# Patient Record
Sex: Male | Born: 1970 | Race: White | Marital: Married | Smoking: Former smoker
Health system: Southern US, Community
[De-identification: ages and names within clinical notes are randomized; demographics above are authoritative.]

---

## 2016-04-02 ENCOUNTER — Emergency Department: Payer: No Typology Code available for payment source

## 2016-04-02 ENCOUNTER — Encounter: Payer: Self-pay | Admitting: Emergency Medicine

## 2016-04-02 ENCOUNTER — Inpatient Hospital Stay: Payer: No Typology Code available for payment source

## 2016-04-02 ENCOUNTER — Inpatient Hospital Stay
Admission: EM | Admit: 2016-04-02 | Discharge: 2016-04-04 | DRG: 190 | Disposition: A | Discharge status: Home / Self Care | Payer: No Typology Code available for payment source | Attending: Specialist | Admitting: Specialist

## 2016-04-02 DIAGNOSIS — R042 Hemoptysis: Secondary | ICD-10-CM | POA: Diagnosis present

## 2016-04-02 DIAGNOSIS — R0902 Hypoxemia: Secondary | ICD-10-CM | POA: Diagnosis present

## 2016-04-02 DIAGNOSIS — R030 Elevated blood-pressure reading, without diagnosis of hypertension: Secondary | ICD-10-CM | POA: Diagnosis present

## 2016-04-02 DIAGNOSIS — J189 Pneumonia, unspecified organism: Secondary | ICD-10-CM | POA: Diagnosis present

## 2016-04-02 DIAGNOSIS — F17210 Nicotine dependence, cigarettes, uncomplicated: Secondary | ICD-10-CM | POA: Diagnosis present

## 2016-04-02 DIAGNOSIS — J440 Chronic obstructive pulmonary disease with acute lower respiratory infection: Principal | ICD-10-CM | POA: Diagnosis present

## 2016-04-02 DIAGNOSIS — J449 Chronic obstructive pulmonary disease, unspecified: Secondary | ICD-10-CM | POA: Diagnosis not present

## 2016-04-02 DIAGNOSIS — J441 Chronic obstructive pulmonary disease with (acute) exacerbation: Secondary | ICD-10-CM | POA: Diagnosis present

## 2016-04-02 LAB — COMPREHENSIVE METABOLIC PANEL
ALBUMIN: 3.6 g/dL (ref 3.5–5.0)
ALT: 18 U/L (ref 17–63)
ALT: 17 U/L (ref 17–63)
AST: 22 U/L (ref 15–41)
AST: 17 U/L (ref 15–41)
Albumin: 4 g/dL (ref 3.5–5.0)
Alkaline Phosphatase: 80 U/L (ref 38–126)
Alkaline Phosphatase: 67 U/L (ref 38–126)
Anion gap: 8 (ref 5–15)
Anion gap: 7 (ref 5–15)
BILIRUBIN TOTAL: 0.7 mg/dL (ref 0.3–1.2)
BUN: 18 mg/dL (ref 6–20)
BUN: 16 mg/dL (ref 6–20)
CHLORIDE: 105 mmol/L (ref 101–111)
CHLORIDE: 107 mmol/L (ref 101–111)
CO2: 23 mmol/L (ref 22–32)
CO2: 23 mmol/L (ref 22–32)
CREATININE: 0.94 mg/dL (ref 0.61–1.24)
CREATININE: 0.85 mg/dL (ref 0.61–1.24)
Calcium: 9.3 mg/dL (ref 8.9–10.3)
Calcium: 8.5 mg/dL — ABNORMAL LOW (ref 8.9–10.3)
GFR calc Af Amer: >60 mL/min (ref >60)
GFR calc Af Amer: >60 mL/min (ref >60)
GLUCOSE: 92 mg/dL (ref 65–99)
Glucose, Bld: 121 mg/dL — ABNORMAL HIGH (ref 65–99)
POTASSIUM: 3.8 mmol/L (ref 3.5–5.1)
Potassium: 3.7 mmol/L (ref 3.5–5.1)
Sodium: 136 mmol/L (ref 135–145)
Sodium: 137 mmol/L (ref 135–145)
TOTAL PROTEIN: 7.1 g/dL (ref 6.5–8.1)
Total Bilirubin: 0.7 mg/dL (ref 0.3–1.2)
Total Protein: 6.3 g/dL — ABNORMAL LOW (ref 6.5–8.1)

## 2016-04-02 LAB — RAPID HIV SCREEN (HIV 1/2 AB+AG)
HIV 1/2 ANTIBODIES: NONREACTIVE
HIV-1 P24 ANTIGEN - HIV24: NONREACTIVE

## 2016-04-02 LAB — CBC WITH DIFFERENTIAL/PLATELET
BASOS ABS: 0.1 10*3/uL (ref 0–0.1)
Basophils Relative: 1 %
EOS ABS: 0.3 10*3/uL (ref 0–0.7)
EOS PCT: 3 %
HCT: 42.9 % (ref 40.0–52.0)
HEMOGLOBIN: 14.7 g/dL (ref 13.0–18.0)
LYMPHS PCT: 20 %
Lymphs Abs: 2.4 10*3/uL (ref 1.0–3.6)
MCH: 28.8 pg (ref 26.0–34.0)
MCHC: 34.2 g/dL (ref 32.0–36.0)
MCV: 84.4 fL (ref 80.0–100.0)
Monocytes Absolute: 0.8 10*3/uL (ref 0.2–1.0)
Monocytes Relative: 7 %
NEUTROS PCT: 69 %
Neutro Abs: 8.1 10*3/uL — ABNORMAL HIGH (ref 1.4–6.5)
PLATELETS: 363 10*3/uL (ref 150–440)
RBC: 5.09 MIL/uL (ref 4.40–5.90)
RDW: 13.7 % (ref 11.5–14.5)
WBC: 11.7 10*3/uL — AB (ref 3.8–10.6)

## 2016-04-02 LAB — URINE DRUG SCREEN, QUALITATIVE (ARMC ONLY)
AMPHETAMINES, UR SCREEN: NOT DETECTED
BARBITURATES, UR SCREEN: NOT DETECTED
BENZODIAZEPINE, UR SCRN: NOT DETECTED
Cannabinoid 50 Ng, Ur ~~LOC~~: POSITIVE — AB
Cocaine Metabolite,Ur ~~LOC~~: NOT DETECTED
MDMA (Ecstasy)Ur Screen: NOT DETECTED
METHADONE SCREEN, URINE: NOT DETECTED
Opiate, Ur Screen: NOT DETECTED
Phencyclidine (PCP) Ur S: NOT DETECTED
Tricyclic, Ur Screen: NOT DETECTED

## 2016-04-02 LAB — LACTIC ACID, PLASMA
LACTIC ACID, VENOUS: 0.7 mmol/L (ref 0.5–2.0)
LACTIC ACID, VENOUS: 0.7 mmol/L (ref 0.5–2.0)

## 2016-04-02 LAB — EXPECTORATED SPUTUM ASSESSMENT W REFEX TO RESP CULTURE

## 2016-04-02 LAB — TYPE AND SCREEN
ABO/RH(D): A POS
Antibody Screen: NEGATIVE

## 2016-04-02 LAB — EXPECTORATED SPUTUM ASSESSMENT W GRAM STAIN, RFLX TO RESP C: Special Requests: NORMAL

## 2016-04-02 LAB — MAGNESIUM: MAGNESIUM: 1.8 mg/dL (ref 1.7–2.4)

## 2016-04-02 LAB — INFLUENZA PANEL BY PCR (TYPE A & B)
H1N1 flu by pcr: NOT DETECTED
INFLAPCR: NEGATIVE
Influenza B By PCR: NEGATIVE

## 2016-04-02 LAB — ETHANOL

## 2016-04-02 LAB — PROTIME-INR
INR: 0.92
Prothrombin Time: 12.6 seconds (ref 11.4–15.0)

## 2016-04-02 LAB — GLUCOSE, CAPILLARY: GLUCOSE-CAPILLARY: 93 mg/dL (ref 65–99)

## 2016-04-02 LAB — TROPONIN I: Troponin I: <0.03 ng/mL (ref <0.031)

## 2016-04-02 LAB — MRSA PCR SCREENING: MRSA BY PCR: NEGATIVE

## 2016-04-02 LAB — ABO/RH: ABO/RH(D): A POS

## 2016-04-02 LAB — PHOSPHORUS: Phosphorus: 2.9 mg/dL (ref 2.5–4.6)

## 2016-04-02 MED ORDER — DEXTROSE 5 % IV SOLN
500.0000 mg | Freq: Once | INTRAVENOUS | Status: DC
  Administered 2016-04-02: 500 mg via INTRAVENOUS

## 2016-04-02 MED ORDER — SODIUM CHLORIDE 0.9 % IV BOLUS (SEPSIS)
1000.0000 mL | Freq: Once | INTRAVENOUS | Status: AC
  Administered 2016-04-02: 1000 mL via INTRAVENOUS

## 2016-04-02 MED ORDER — SODIUM CHLORIDE 0.9 % IV SOLN: 250.0000 mL | INTRAVENOUS | Status: DC | PRN

## 2016-04-02 MED ORDER — HYDRALAZINE HCL 20 MG/ML IJ SOLN
10.0000 mg | INTRAMUSCULAR | Status: DC | PRN
  Administered 2016-04-03: 10 mg via INTRAVENOUS
  Filled 2016-04-02: qty 1

## 2016-04-02 MED ORDER — DEXTROSE 5 % IV SOLN
1.0000 g | INTRAVENOUS | Status: DC
  Administered 2016-04-03 – 2016-04-04 (×2): 1 g via INTRAVENOUS
  Filled 2016-04-02 (×2): qty 10

## 2016-04-02 MED ORDER — DEXTROSE 5 % IV SOLN
500.0000 mg | INTRAVENOUS | Status: DC
  Administered 2016-04-03 – 2016-04-04 (×2): 500 mg via INTRAVENOUS
  Filled 2016-04-02 (×3): qty 500

## 2016-04-02 MED ORDER — FAMOTIDINE IN NACL 20-0.9 MG/50ML-% IV SOLN
20.0000 mg | Freq: Two times a day (BID) | INTRAVENOUS | Status: DC
  Administered 2016-04-02 – 2016-04-04 (×5): 20 mg via INTRAVENOUS
  Filled 2016-04-02 (×6): qty 50

## 2016-04-02 MED ORDER — BUDESONIDE 0.5 MG/2ML IN SUSP
0.5000 mg | Freq: Two times a day (BID) | RESPIRATORY_TRACT | Status: DC
  Administered 2016-04-02 – 2016-04-04 (×5): 0.5 mg via RESPIRATORY_TRACT
  Filled 2016-04-02 (×5): qty 2

## 2016-04-02 MED ORDER — SODIUM CHLORIDE 0.9% FLUSH: 3.0000 mL | INTRAVENOUS | Status: DC | PRN

## 2016-04-02 MED ORDER — IPRATROPIUM-ALBUTEROL 0.5-2.5 (3) MG/3ML IN SOLN
3.0000 mL | RESPIRATORY_TRACT | Status: DC
  Administered 2016-04-02 – 2016-04-03 (×5): 3 mL via RESPIRATORY_TRACT
  Filled 2016-04-02 (×5): qty 3

## 2016-04-02 MED ORDER — DEXTROSE 5 % IV SOLN
500.0000 mg | Freq: Once | INTRAVENOUS | Status: AC
  Administered 2016-04-02: 500 mg via INTRAVENOUS
  Filled 2016-04-02: qty 500

## 2016-04-02 MED ORDER — METHYLPREDNISOLONE SODIUM SUCC 40 MG IJ SOLR
40.0000 mg | Freq: Two times a day (BID) | INTRAMUSCULAR | Status: DC
  Administered 2016-04-02 – 2016-04-04 (×4): 40 mg via INTRAVENOUS
  Filled 2016-04-02 (×4): qty 1

## 2016-04-02 MED ORDER — ONDANSETRON HCL 4 MG/2ML IJ SOLN: 4.0000 mg | Freq: Four times a day (QID) | INTRAMUSCULAR | Status: DC | PRN

## 2016-04-02 MED ORDER — SODIUM CHLORIDE 0.9% FLUSH
3.0000 mL | Freq: Two times a day (BID) | INTRAVENOUS | Status: DC
  Administered 2016-04-02 – 2016-04-03 (×4): 3 mL via INTRAVENOUS

## 2016-04-02 MED ORDER — ACETAMINOPHEN 325 MG PO TABS
650.0000 mg | ORAL_TABLET | ORAL | Status: DC | PRN
  Administered 2016-04-04: 650 mg via ORAL
  Filled 2016-04-02: qty 2

## 2016-04-02 MED ORDER — DEXTROSE 5 % IV SOLN
1.0000 g | Freq: Once | INTRAVENOUS | Status: DC
  Filled 2016-04-02: qty 10

## 2016-04-02 MED ORDER — IOPAMIDOL (ISOVUE-370) INJECTION 76%
75.0000 mL | Freq: Once | INTRAVENOUS | Status: AC | PRN
  Administered 2016-04-02: 75 mL via INTRAVENOUS

## 2016-04-02 NOTE — H&P (Signed)
[facility] Pulmonary Medicine Consultation      Date: 04/02/2016,   MRN# [id number] Kjell Brannen 1971-08-17 Code Status:     Code Status Orders        Start     Ordered   04/02/16 1027  Full code   Continuous     04/02/16 1031    Code Status History    Date Active Date Inactive Code Status Order ID Comments User Context   This patient has a current code status but no historical code status.     Hosp day:@LENGTHOFSTAYDAYS @ Referring MD: @     PCP:      AdmissionWeight: 180 lb (81.647 kg)                 CurrentWeight: 180 lb (81.647 kg)      CHIEF COMPLAINT:   Acute massive hemoptysis   HISTORY OF PRESENT ILLNESS   45 yo white male with extensive smoking history with h/o Marijuana abuse seen today and admitted for acute and severe hemoptysis since 2 days ago. Patient had difficulty breathing and hypoxia, patient states that he feels much better over last 1 hr.  Patient states that he was coughing up 2 cup fulls of blood. Patient with elevated BP, patient denies drug use  Ct chest shows extensive b/l emphysema with Left upper and lower lobe pneumonia Patient has no fevers, chills, NVD.    PAST MEDICAL HISTORY   History reviewed. No pertinent past medical history.   SURGICAL HISTORY   History reviewed. No pertinent past surgical history.   FAMILY HISTORY   History reviewed. No pertinent family history. no h/o cancer No h/o ca  SOCIAL HISTORY   Social History  Substance Use Topics  . Smoking status: Current Every Day Smoker -- 1.50 packs/day    Types: Cigarettes  . Smokeless tobacco: None  . Alcohol Use: Yes     Comment: daily use.  Last drank 6 pack beer and 2-3 shots of liqour     MEDICATIONS    Home Medication:  No current outpatient prescriptions on file.  Current Medication:  Current facility-administered medications:  .  0.9 %  sodium chloride infusion, 250 mL, Intravenous, PRN, Erin Fulling, MD .  0.9 %  sodium chloride  infusion, 250 mL, Intravenous, PRN, Erin Fulling, MD .  acetaminophen (TYLENOL) tablet 650 mg, 650 mg, Oral, Q4H PRN, Erin Fulling, MD .  azithromycin (ZITHROMAX) 500 mg in dextrose 5 % 250 mL IVPB, 500 mg, Intravenous, Once, Governor Rooks, MD .  cefTRIAXone (ROCEPHIN) 1 g in dextrose 5 % 50 mL IVPB, 1 g, Intravenous, Once, Governor Rooks, MD .  famotidine (PEPCID) IVPB 20 mg premix, 20 mg, Intravenous, Q12H, Erin Fulling, MD, Stopped at 04/02/16 1126 .  hydrALAZINE (APRESOLINE) injection 10 mg, 10 mg, Intravenous, Q4H PRN, Erin Fulling, MD .  ondansetron (ZOFRAN) injection 4 mg, 4 mg, Intravenous, Q6H PRN, Erin Fulling, MD .  sodium chloride flush (NS) 0.9 % injection 3 mL, 3 mL, Intravenous, Q12H, Erin Fulling, MD .  sodium chloride flush (NS) 0.9 % injection 3 mL, 3 mL, Intravenous, PRN, Erin Fulling, MD    ALLERGIES   Penicillins     REVIEW OF SYSTEMS   Review of Systems  Constitutional: Negative for fever, chills, weight loss, malaise/fatigue and diaphoresis.  HENT: Negative for congestion and hearing loss.   Eyes: Negative for blurred vision and double vision.  Respiratory: Positive for cough, hemoptysis, sputum production, shortness of breath and wheezing.   Cardiovascular:  Negative for chest pain, palpitations and orthopnea.  Gastrointestinal: Negative for heartburn, nausea, vomiting, abdominal pain, diarrhea, constipation and blood in stool.  Genitourinary: Negative for dysuria and urgency.  Musculoskeletal: Negative for myalgias, back pain and neck pain.  Skin: Negative for rash.  Neurological: Negative for dizziness, tingling, tremors, weakness and headaches.  Endo/Heme/Allergies: Does not bruise/bleed easily.  Psychiatric/Behavioral: Negative for depression, suicidal ideas and substance abuse.  All other systems reviewed and are negative.    VS: BP 139/98 mmHg  Pulse 78  Temp(Src) 98.2 F (36.8 C) (Oral)  Resp 15  Ht 6' (1.829 m)  Wt 180 lb (81.647 kg)  BMI 24.41 kg/m2   SpO2 96%     PHYSICAL EXAM  Physical Exam  Constitutional: He is oriented to person, place, and time. He appears well-developed and well-nourished. No distress.  HENT:  Head: Normocephalic and atraumatic.  Mouth/Throat: No oropharyngeal exudate.  Eyes: EOM are normal. Pupils are equal, round, and reactive to light. No scleral icterus.  Neck: Normal range of motion. Neck supple.  Cardiovascular: Normal rate, regular rhythm and normal heart sounds.   No murmur heard. Pulmonary/Chest: No stridor. No respiratory distress. He has no wheezes.  Abdominal: Soft. Bowel sounds are normal. He exhibits no distension. There is no tenderness. There is no rebound.  Musculoskeletal: Normal range of motion. He exhibits no edema.  Neurological: He is alert and oriented to person, place, and time. He displays normal reflexes. Coordination normal.  Skin: Skin is warm. He is not diaphoretic.  Psychiatric: He has a normal mood and affect.        LABS    Recent Labs     04/02/16  1009  04/02/16  1156  HGB  14.7   --   HCT  42.9   --   MCV  84.4   --   WBC  11.7*   --   BUN  18  16  CREATININE  0.94  0.85  GLUCOSE  121*  92  CALCIUM  9.3  8.5*  INR  0.92   --   ,    No results for input(s): PH in the last 72 hours.  Invalid input(s): PCO2, PO2, BASEEXCESS, BASEDEFICITE, TFT    CULTURE RESULTS   No results found for this or any previous visit (from the past 240 hour(s)).        IMAGING    Ct Angio Chest Pe W/cm &/or Wo Cm  04/02/2016  CLINICAL DATA:  Hemoptysis EXAM: CT ANGIOGRAPHY CHEST WITH CONTRAST TECHNIQUE: Multidetector CT imaging of the chest was performed using the standard protocol during bolus administration of intravenous contrast. Multiplanar CT image reconstructions and MIPs were obtained to evaluate the vascular anatomy. CONTRAST:  75 mL Isovue 370 IV COMPARISON:  Chest radiograph dated 04/02/2016 FINDINGS: No evidence of pulmonary embolism. Although not tailored  for evaluation of the thoracic aorta, there is no evidence of thoracic aortic aneurysm or dissection. Mediastinum/Nodes: The heart is normal in size. No pericardial effusion. Mild atherosclerotic calcifications aortic arch. No suspicious mediastinal lymphadenopathy. Visualized thyroid is unremarkable. Lungs/Pleura: Mild ground-glass nodular opacities in the lingula and left lower lobe, suspicious for pneumonia. Underlying moderate centrilobular and paraseptal emphysematous changes, upper lobe predominant. Biapical pleural-parenchymal scarring. No suspicious pulmonary nodules. No pleural effusion or pneumothorax. Upper abdomen: Visualized upper abdomen is within normal limits. Musculoskeletal: Mild degenerative changes of the visualized thoracolumbar spine. Review of the MIP images confirms the above findings. IMPRESSION: No evidence of pulmonary embolism. Mild ground-glass nodular  opacities in the lingula and left lower lobe, suspicious for multifocal pneumonia. Electronically Signed   By: Charline Bills M.D.   On: 04/02/2016 11:21   Dg Chest Port 1 View  04/02/2016  CLINICAL DATA:  Hemoptysis for several days. EXAM: PORTABLE CHEST 1 VIEW COMPARISON:  None. FINDINGS: The heart size and mediastinal contours are within normal limits. Both lungs are clear. No evidence of pulmonary infiltrate or edema. No evidence of pleural effusion. Pulmonary hyperinflation, suspicious for COPD. IMPRESSION: Probable COPD.  No active disease. Electronically Signed   By: Myles Rosenthal M.D.   On: 04/02/2016 10:43           ASSESSMENT/PLAN   45 yo white male with acute sub massive hemoptysis from acute left upper lobe multifocal pneumonia with COPD exacerbation  1.start solumedrol 40 bid 2.start rocephin and azithromycin 3.check HIV, INLF PCR 4.check tox screen 5.hydralazine as needed 6.oxygen as needed 7.dounebs and pulmicort nebs 8.step down status 9.sputum culture 10.check AFB  Can transfer to gen med floor  in next 24      I have personally obtained a history, examined the patient, evaluated laboratory and independently reviewed imaging results, formulated the assessment and plan and placed orders.  The Patient requires high complexity decision making for assessment and support, frequent evaluation and titration of therapies, application of advanced monitoring technologies and extensive interpretation of multiple databases.   Patient are satisfied with Plan of action and management. All questions answered  Lucie Leather, M.D.  [facility]  Medical Director [facility] Medical Director [facility] Cardio-Pulmonary Department

## 2016-04-02 NOTE — ED Provider Notes (Signed)
[facility] Emergency Department Provider Note   ____________________________________________  Time seen: Immediately upon arrival to room I have reviewed the triage vital signs and the triage nursing note.  HISTORY  Chief Complaint Hemoptysis   Historian Patient  HPI Walter Pratt is a 45 y.o. male who does not follow with doctors, is a smoker, reports coughed up some blood on Thursday, continued to have episodes of coughing up bright red blood 40 and Saturday, and more short of breath this morning, continuing to cough up blood. Prior to the coughing up blood, there is no shortness of breath, fever, chills, or otherwise sputum production. Denies abdominal pain or vomiting. This morning he had some blood come out of his nose, which he thinks came up from his coughing and then came out of his nose. He is not taking a blood thinner.  The nurse at his work has been treating him with a nasal spray for sinus congestion.    History reviewed. No pertinent past medical history.  Patient Active Problem List   Diagnosis Date Noted  . Hemoptysis 04/02/2016    History reviewed. No pertinent past surgical history.  Current Outpatient Rx  Name  Route  Sig  Dispense  Refill  . aspirin-acetaminophen-caffeine (EXCEDRIN MIGRAINE) 250-250-65 MG tablet   Oral   Take 3 tablets by mouth 2 (two) times daily as needed for migraine.           Allergies Penicillins  History reviewed. No pertinent family history.  Social History Social History  Substance Use Topics  . Smoking status: Current Every Day Smoker -- 1.50 packs/day    Types: Cigarettes  . Smokeless tobacco: None  . Alcohol Use: Yes     Comment: daily use.  Last drank 6 pack beer and 2-3 shots of liqour    Review of Systems  Constitutional: Negative for fever. Eyes: Negative for visual changes. ENT: Negative for sore throat. Cardiovascular: Negative for chest pain.Sensation of  "gurgling" Respiratory: Positive for shortness of breath. Gastrointestinal: Negative for abdominal pain, vomiting and diarrhea. Genitourinary: Negative for dysuria. Musculoskeletal: Negative for back pain. Skin: Negative for rash. Neurological: Negative for headache. 10 point Review of Systems otherwise negative ____________________________________________   PHYSICAL EXAM:  VITAL SIGNS: ED Triage Vitals  Enc Vitals Group     BP 04/02/16 0955 149/96 mmHg     Pulse Rate 04/02/16 0955 110     Resp 04/02/16 0955 18     Temp 04/02/16 0955 98.2 F (36.8 C)     Temp Source 04/02/16 0955 Oral     SpO2 04/02/16 0955 96 %     Weight 04/02/16 0955 180 lb (81.647 kg)     Height 04/02/16 0955 6' (1.829 m)     Head Cir --      Peak Flow --      Pain Score 04/02/16 1003 0     Pain Loc --      Pain Edu? --      Excl. in GC? --      Constitutional: Alert and oriented. Well appearing and in no distress. HEENT   Head: Normocephalic and atraumatic.      Eyes: Conjunctivae are normal. PERRL. Normal extraocular movements.      Ears:         Nose: No congestion/rhinnorhea.   Mouth/Throat: Mucous membranes are moist.   Neck: No stridor. Cardiovascular/Chest: Tachycardic, regular rhythm.  No murmurs, rubs, or gallops. Respiratory: Normal respiratory effort without tachypnea nor retractions. Mild  wheezing. Mild decreased air movement throughout Gastrointestinal: Soft. No distention, no guarding, no rebound. Nontender.  Genitourinary/rectal:Deferred Musculoskeletal: Nontender with normal range of motion in all extremities. No joint effusions.  No lower extremity tenderness.  No edema. Neurologic:  Normal speech and language. No gross or focal neurologic deficits are appreciated. Skin:  Skin is warm, dry and intact. No rash noted. Psychiatric: Mood and affect are normal. Speech and behavior are normal. Patient exhibits appropriate insight and  judgment.  ____________________________________________   EKG I, Governor Rooks, MD, the attending physician have personally viewed and interpreted all ECGs.  91 bpm. Normal sinus rhythm. Narrow QRS. Normal ST and T-wave ____________________________________________  LABS (pertinent positives/negatives)  Comprehensive metabolic panel without significant abnormalities Troponin less than 0.03 White blood count 11.7, hemoglobin 14.7 and platelet count 363 INR 0.92  ____________________________________________  RADIOLOGY All Xrays were viewed by me. Imaging interpreted by Radiologist.  Portable 1 view: Chest: Probable COPD. No active disease  CT chest for PE:  IMPRESSION: No evidence of pulmonary embolism.  Mild ground-glass nodular opacities in the lingula and left lower lobe, suspicious for multifocal pneumonia. __________________________________________  PROCEDURES  Procedure(s) performed: None  Critical Care performed: CRITICAL CARE Performed by: Governor Rooks   Total critical care time: 30 minutes  Critical care time was exclusive of separately billable procedures and treating other patients.  Critical care was necessary to treat or prevent imminent or life-threatening deterioration.  Critical care was time spent personally by me on the following activities: development of treatment plan with patient and/or surrogate as well as nursing, discussions with consultants, evaluation of patient's response to treatment, examination of patient, obtaining history from patient or surrogate, ordering and performing treatments and interventions, ordering and review of laboratory studies, ordering and review of radiographic studies, pulse oximetry and re-evaluation of patient's condition.   ____________________________________________   ED COURSE / ASSESSMENT AND PLAN  Pertinent labs & imaging results that were available during my care of the patient were reviewed by me and  considered in my medical decision making (see chart for details).   This patient came in complaining of shortness of breath, tachycardic, and coughing up bright red blood. Although there are no clots, patient is bringing up enough bright red blood to saturate the paper towel. He did this several times. His heart rate was in the 110s. No hypotension or hypoxia. 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  Initial differential also included nosebleed and hematemesis, but it sounds like it's actually pulmonary in origin.  Good peripheral IV access was obtained, started IV fluids. Type and screen was sent. Patient gave blood consent if needed.  Although I was initially considering preemptive intubation out of concern for airway protection, I did choose to hold off as patient was able to talk, although anxious. His heart rate did go down to the 90s, and I think that his tachycardia was prompted by anxiety rather than hypovolemia.  Labs are reassuring with a hemoglobin 14.7, no low platelets. Normal INR. He is not on aspirin or any other blood thinners.  Chest x-ray was essentially clear, certainly no large lung mass visualized.  CT scan of the chest shows no mass or PE. There is evidence of multifocal pneumonia. I added on a lactate and blood cultures and will treat patient with Rocephin and azithromycin. His penicillin allergy is listed as unknown.    CONSULTATIONS:  ICU, Dr.  Kasa for admission.    Patient / Family / Caregiver informed of clinical course, medical decision-making process, and agree with plan.    ___________________________________________   FINAL CLINICAL IMPRESSION(S) / ED DIAGNOSES   Final diagnoses:  Hemoptysis  Pneumonia involving left lung, unspecified part of lung              Note: This dictation was prepared with Dragon dictation. Any  transcriptional errors that result from this process are unintentional   Governor Rooksebecca Adley Mazurowski, MD 04/02/16 1139

## 2016-04-02 NOTE — ED Notes (Signed)
Patient states he has been coughing up blood for several days. Observed patient in triage coughing up blood.  Pt states he does have a history of alcohol use and ulcer.

## 2016-04-02 NOTE — Progress Notes (Signed)
Brief Nutrition Note  Consult received for Assessment for enteral/tube feeding initiation and management.  Pt currently NPO. Pt not currently on the vent. No tube in place. Full assessment to follow.  Admitting Dx: Hemoptysis [R04.2] Pneumonia involving left lung, unspecified part of lung [J18.9]  Body mass index is 24.41 kg/(m^2).   Labs:   Recent Labs Lab 04/02/16 1009 04/02/16 1156  NA 136 137  K 3.7 3.8  CL 105 107  CO2 23 23  BUN 18 16  CREATININE 0.94 0.85  CALCIUM 9.3 8.5*  MG  --  1.8  PHOS  --  2.9  GLUCOSE 121* [address]    Sharman Garrott, RD, LDN Pager [phone] Weekend/On-Call Pager [phone]

## 2016-04-02 NOTE — Progress Notes (Signed)
Pharmacy Antibiotic Note  Walter Pratt is a 45 y.o. male admitted on 04/02/2016 with hemoptysis and pneumonia.  Pharmacy has been consulted for antibiotic renal dose adjustment dosing.  Plan: Ceftriaxone 1 g iv q 24 hours and azithromycin 500 mg iv q 24 hours.   Height: 6' (182.9 cm) Weight: 180 lb (81.647 kg) IBW/kg (Calculated) : 77.6  Temp (24hrs), Avg:98.5 F (36.9 C), Min:98.2 F (36.8 C), Max:98.7 F (37.1 C)   Recent Labs Lab 04/02/16 1009 04/02/16 1156  WBC 11.7*  --   CREATININE 0.94 0.85  LATICACIDVEN  --  0.7    Estimated Creatinine Clearance: 120.5 mL/min (by C-G formula based on Cr of 0.85).    Allergies  Allergen Reactions  . Penicillins Other (See Comments)    Childhood allergy. Has patient had a PCN reaction causing immediate rash, facial/tongue/throat swelling, SOB or lightheadedness with hypotension: No patient not sure Has patient had a PCN reaction causing severe rash involving mucus membranes or skin necrosis: No patient not sure Has patient had a PCN reaction that required hospitalization No patient not sure. Has patient had a PCN reaction occurring within the last 10 years: No If all of the above answer    Antimicrobials this admission: azithromycin 4/23 >>  ceftriaxone 4/23 >>   Dose adjustments this admission:   Microbiology results: BCx: pending Sputum: pending  MRSA PCR: pending  Thank you for allowing pharmacy to be a part of this patient's care.  Walter Pratt, Walter Pratt 04/02/2016 1:22 PM

## 2016-04-02 NOTE — ED Notes (Signed)
Last ate meal yesterday, drank a few cups of coffee this morning PTA and was drinking a MTN on arrival to triage room at [phone]

## 2016-04-03 ENCOUNTER — Inpatient Hospital Stay: Payer: No Typology Code available for payment source

## 2016-04-03 DIAGNOSIS — J189 Pneumonia, unspecified organism: Secondary | ICD-10-CM

## 2016-04-03 DIAGNOSIS — J449 Chronic obstructive pulmonary disease, unspecified: Secondary | ICD-10-CM

## 2016-04-03 LAB — CBC
HEMATOCRIT: 40.4 % (ref 40.0–52.0)
HEMOGLOBIN: 13.6 g/dL (ref 13.0–18.0)
MCH: 28.4 pg (ref 26.0–34.0)
MCHC: 33.6 g/dL (ref 32.0–36.0)
MCV: 84.7 fL (ref 80.0–100.0)
Platelets: 322 10*3/uL (ref 150–440)
RBC: 4.77 MIL/uL (ref 4.40–5.90)
RDW: 13.5 % (ref 11.5–14.5)
WBC: 12.4 10*3/uL — AB (ref 3.8–10.6)

## 2016-04-03 LAB — BASIC METABOLIC PANEL
ANION GAP: 12 (ref 5–15)
BUN: 10 mg/dL (ref 6–20)
CALCIUM: 9.4 mg/dL (ref 8.9–10.3)
CHLORIDE: 108 mmol/L (ref 101–111)
CO2: 20 mmol/L — AB (ref 22–32)
Creatinine, Ser: 0.78 mg/dL (ref 0.61–1.24)
GFR calc non Af Amer: >60 mL/min (ref >60)
Glucose, Bld: 106 mg/dL — ABNORMAL HIGH (ref 65–99)
POTASSIUM: 4.3 mmol/L (ref 3.5–5.1)
Sodium: 140 mmol/L (ref 135–145)

## 2016-04-03 LAB — ALPHA-1 ANTITRYPSIN PHENOTYPE: A-1 Antitrypsin, Ser: 106 mg/dL (ref 90–200)

## 2016-04-03 LAB — HEMOGLOBIN: HEMOGLOBIN: 13.4 g/dL (ref 13.0–18.0)

## 2016-04-03 MED ORDER — IPRATROPIUM-ALBUTEROL 0.5-2.5 (3) MG/3ML IN SOLN: 3.0000 mL | RESPIRATORY_TRACT | Status: DC | PRN

## 2016-04-03 NOTE — Progress Notes (Signed)
PULMONARY / CRITICAL CARE MEDICINE   Name: Walter Pratt MRN: [id number] DOB: [birth year]    ADMISSION DATE:  04/02/2016  BRIEF HISTORY: 45 yo white male with extensive smoking history with h/o Marijuana abuse seen today and admitted for acute and severe hemoptysis since 2 days ago. Patient had difficulty breathing and hypoxia, patient states that he feels much better over last 1 hr.  Patient states that he was coughing up 2 cup fulls of blood. Patient with elevated BP, patient denies drug use  Ct chest shows extensive b/l emphysema with Left upper and lower lobe pneumonia Patient has no fevers, chills, NVD.  SUBJECTIVE:  Doing well today, still with intermittent episodes of hemoptysis, but slowly improving  STUDIES: 4/23 CTA PE Proctol - no PE, Mild ground-glass nodular opacities in the lingula and left lower lobe, suspicious for pneumonia.   VITAL SIGNS: Temp:  [96.8 F (36 C)-98.1 F (36.7 C)] 98.1 F (36.7 C) (04/24 0843) Pulse Rate:  [55-100] 100 (04/24 0843) Resp:  [13-26] 14 (04/24 0244) BP: (113-151)/(74-111) 139/86 mmHg (04/24 0843) SpO2:  [94 %-98 %] 96 % (04/24 0843) Weight:  [170 lb (77.111 kg)] 170 lb (77.111 kg) (04/24 0352) HEMODYNAMICS:   VENTILATOR SETTINGS:   INTAKE / OUTPUT:  Intake/Output Summary (Last 24 hours) at 04/03/16 1440 Last data filed at 04/03/16 0200  Gross per 24 hour  Intake    660 ml  Output   1400 ml  Net   -740 ml    Review of Systems  Constitutional: Negative for fever, chills and weight loss.  HENT: Negative for hearing loss.   Eyes: Negative for blurred vision and double vision.  Respiratory: Positive for cough, hemoptysis, shortness of breath and wheezing.   Cardiovascular: Negative for chest pain and palpitations.  Gastrointestinal: Negative for heartburn and nausea.  Genitourinary: Negative for dysuria.  Musculoskeletal: Negative for myalgias.  Skin: Negative for rash.  Neurological: Negative for dizziness and headaches.   Endo/Heme/Allergies: Does not bruise/bleed easily.  Psychiatric/Behavioral: Negative for depression.    Physical Exam  Constitutional: He is oriented to person, place, and time and well-developed, well-nourished, and in no distress.  HENT:  Head: Normocephalic and atraumatic.  Right Ear: External ear normal.  Left Ear: External ear normal.  Mouth/Throat: Oropharynx is clear and moist.  Neck: Neck supple.  Cardiovascular: Normal rate, regular rhythm and normal heart sounds.   Pulmonary/Chest: Effort normal.  Mild exp wheezes Intermittent hemoptysis - non massive today  Abdominal: Soft. Bowel sounds are normal. He exhibits no distension. There is no tenderness.  Musculoskeletal: Normal range of motion. He exhibits no edema.  Neurological: He is alert and oriented to person, place, and time.  Skin: Skin is warm and dry.  Nursing note and vitals reviewed.    LABS:  CBC  Recent Labs Lab 04/02/16 1009 04/03/16 0504  WBC 11.7* 12.4*  HGB 14.7 13.6  HCT 42.9 40.4  PLT 363 322   Coag's  Recent Labs Lab 04/02/16 1009  INR 0.92   BMET  Recent Labs Lab 04/02/16 1009 04/02/16 1156 04/03/16 0504  NA 136 137 140  K 3.7 3.8 4.3  CL 105 107 108  CO2 23 23 20*  BUN 18 16 10   CREATININE 0.94 0.85 0.78  GLUCOSE 121* 92 106*   Electrolytes  Recent Labs Lab 04/02/16 1009 04/02/16 1156 04/03/16 0504  CALCIUM 9.3 8.5* 9.4  MG  --  1.8  --   PHOS  --  2.9  --  Sepsis Markers  Recent Labs Lab 04/02/16 1156 04/02/16 1436  LATICACIDVEN 0.7 0.7   ABG No results for input(s): PHART, PCO2ART, PO2ART in the last 168 hours. Liver Enzymes  Recent Labs Lab 04/02/16 1009 04/02/16 1156  AST 22 17  ALT 18 17  ALKPHOS 80 67  BILITOT 0.7 0.7  ALBUMIN 4.0 3.6   Cardiac Enzymes  Recent Labs Lab 04/02/16 1009  TROPONINI <0.03   Glucose  Recent Labs Lab 04/02/16 1321  GLUCAP 93    Imaging Dg Chest Port 1 View  04/03/2016  CLINICAL DATA:   Hemoptysis. EXAM: PORTABLE CHEST 1 VIEW COMPARISON:  CT 04/02/2016. FINDINGS: Mediastinum hilar structures normal. Interim partial clearing of left lower lobe patchy infiltrates. No pleural effusion or pneumothorax. Heart size normal . IMPRESSION: Interim partial clearing of left lower lobe patchy pulmonary infiltrates. Electronically Signed   By: Maisie Fus  Register   On: 04/03/2016 07:25    LINES:   CULTURES: Respiratory  ANTIBIOTICS Rocephin 4/23>> Azithromycin 4/23>>   ASSESSMENT / PLAN: 45 yo white male with acute sub massive hemoptysis from acute left upper lobe multifocal pneumonia with COPD exacerbation  1.transition IV steroid to PO - start  taper over 10 days 2.Cont rocephin and azithromycin - can d/c on Levaquin  x 10 days or Augmentin ES x 10 days.  3.check HIV, INLF PCR 4.check tox screen 5.hydralazine as needed 6.oxygen as needed 7.duonebs and pulmicort nebs 8.stable 9.sputum culture 10.check AFB 11. Does not need emergent bronch 12. Outpatient CT chest in 3 months - clinic will setup this   Pulmonary Consult time - 35 mins  Stephanie Acre, MD [facility] Pager [phone] (please enter 7-digits) On Call Pager - [phone] (please enter 7-digits)  Note: This note was prepared with Dragon dictation along with smaller phrase technology. Any transcriptional errors that result from this process are unintentional.

## 2016-04-03 NOTE — Progress Notes (Signed)
Nutrition Follow-up     INTERVENTION:  Await diet progression    NUTRITION DIAGNOSIS:   Inadequate oral intake related to acute illness as evidenced by NPO status.    GOAL:   Patient will meet greater than or equal to 90% of their needs    MONITOR:   PO intake  REASON FOR ASSESSMENT:   Consult Enteral/tube feeding initiation and management  ASSESSMENT:   45 y/o male admitted with pneumonia, COPD exacerbation, hemoptysis.  Transferred out of ICU on 4/23 History reviewed. No pertinent past medical history.  Medications reviewed solumedrol Labs reviewed BUN and creatinine, glucose 106, WBC 12.4  Pt continues to be NPO   Unable to complete Nutrition-Focused physical exam at this time.    Diet Order:  Diet NPO time specified  Skin:  Reviewed, no issues  Last BM:  4/22  Height:   Ht Readings from Last 1 Encounters:  04/02/16 6' (1.829 m)    Weight:   Wt Readings from Last 1 Encounters:  04/03/16 170 lb (77.111 kg)    Ideal Body Weight:     BMI:  Body mass index is 23.05 kg/(m^2).  Estimated Nutritional Needs:   Kcal:  1925-2310 kcals/d  Protein:  77-92 g/d  Fluid:  >2L/d  EDUCATION NEEDS:   No education needs identified at this time  Priscella Donna B. Freida BusmanAllen, RD, LDN [phone] (pager) Weekend/On-Call pager [phone])

## 2016-04-03 NOTE — Progress Notes (Signed)
[facility] - [place] at [facility]   PATIENT NAME: Walter BeneKenneth Pratt    MR#:  [id number]  DATE OF BIRTH:  03-22-1971  SUBJECTIVE:   Patient here due to shortness of breath and acute hemoptysis. Still having active hemoptysis but hemodynamically stable. Hemoglobin stable. Not hypoxic. No chest pain.  REVIEW OF SYSTEMS:    Review of Systems  Constitutional: Negative for fever and chills.  HENT: Negative for congestion and tinnitus.   Eyes: Negative for blurred vision and double vision.  Respiratory: Positive for cough and hemoptysis. Negative for shortness of breath and wheezing.   Cardiovascular: Negative for chest pain, orthopnea and PND.  Gastrointestinal: Negative for nausea, vomiting, abdominal pain and diarrhea.  Genitourinary: Negative for dysuria and hematuria.  Neurological: Negative for dizziness, sensory change and focal weakness.  All other systems reviewed and are negative.   Nutrition: Nothing by mouth Tolerating Diet: Not yet Tolerating PT: Ambulatory     DRUG ALLERGIES:   Allergies  Allergen Reactions  . Penicillins Other (See Comments)    Childhood allergy. Has patient had a PCN reaction causing immediate rash, facial/tongue/throat swelling, SOB or lightheadedness with hypotension: No patient not sure Has patient had a PCN reaction causing severe rash involving mucus membranes or skin necrosis: No patient not sure Has patient had a PCN reaction that required hospitalization No patient not sure. Has patient had a PCN reaction occurring within the last 10 years: No If all of the above answer    VITALS:  Blood pressure 139/86, pulse 100, temperature 98.1 F (36.7 C), temperature source Oral, resp. rate 14, height 6' (1.829 m), weight 77.111 kg (170 lb), SpO2 96 %.  PHYSICAL EXAMINATION:   Physical Exam  GENERAL:  45 y.o.-year-old patient lying in the bed with no acute distress.  EYES: Pupils equal, round, reactive to light and  accommodation. No scleral icterus. Extraocular muscles intact.  HEENT: Head atraumatic, normocephalic. Oropharynx and nasopharynx clear.  NECK:  Supple, no jugular venous distention. No thyroid enlargement, no tenderness.  LUNGS: Normal breath sounds bilaterally, no wheezing, rales, rhonchi. No use of accessory muscles of respiration.  CARDIOVASCULAR: S1, S2 normal. No murmurs, rubs, or gallops.  ABDOMEN: Soft, nontender, nondistended. Bowel sounds present. No organomegaly or mass.  EXTREMITIES: No cyanosis, clubbing or edema b/l.    NEUROLOGIC: Cranial nerves II through XII are intact. No focal Motor or sensory deficits b/l.   PSYCHIATRIC: The patient is alert and oriented x 3.  SKIN: No obvious rash, lesion, or ulcer.    LABORATORY PANEL:   CBC  Recent Labs Lab 04/03/16 0504  WBC 12.4*  HGB 13.6  HCT 40.4  PLT 322   ------------------------------------------------------------------------------------------------------------------  Chemistries   Recent Labs Lab 04/02/16 1156 04/03/16 0504  NA 137 140  K 3.8 4.3  CL 107 108  CO2 23 20*  GLUCOSE 92 106*  BUN 16 10  CREATININE 0.85 0.78  CALCIUM 8.5* 9.4  MG 1.8  --   AST 17  --   ALT 17  --   ALKPHOS 67  --   BILITOT 0.7  --    ------------------------------------------------------------------------------------------------------------------  Cardiac Enzymes  Recent Labs Lab 04/02/16 1009  TROPONINI <0.03   ------------------------------------------------------------------------------------------------------------------  RADIOLOGY:  Ct Angio Chest Pe W/cm &/or Wo Cm  04/02/2016  CLINICAL DATA:  Hemoptysis EXAM: CT ANGIOGRAPHY CHEST WITH CONTRAST TECHNIQUE: Multidetector CT imaging of the chest was performed using the standard protocol during bolus administration of intravenous contrast. Multiplanar CT image reconstructions and MIPs  were obtained to evaluate the vascular anatomy. CONTRAST:  75 mL Isovue 370 IV  COMPARISON:  Chest radiograph dated 04/02/2016 FINDINGS: No evidence of pulmonary embolism. Although not tailored for evaluation of the thoracic aorta, there is no evidence of thoracic aortic aneurysm or dissection. Mediastinum/Nodes: The heart is normal in size. No pericardial effusion. Mild atherosclerotic calcifications aortic arch. No suspicious mediastinal lymphadenopathy. Visualized thyroid is unremarkable. Lungs/Pleura: Mild ground-glass nodular opacities in the lingula and left lower lobe, suspicious for pneumonia. Underlying moderate centrilobular and paraseptal emphysematous changes, upper lobe predominant. Biapical pleural-parenchymal scarring. No suspicious pulmonary nodules. No pleural effusion or pneumothorax. Upper abdomen: Visualized upper abdomen is within normal limits. Musculoskeletal: Mild degenerative changes of the visualized thoracolumbar spine. Review of the MIP images confirms the above findings. IMPRESSION: No evidence of pulmonary embolism. Mild ground-glass nodular opacities in the lingula and left lower lobe, suspicious for multifocal pneumonia. Electronically Signed   By: Charline Bills M.D.   On: 04/02/2016 11:21   Dg Chest Port 1 View  04/03/2016  CLINICAL DATA:  Hemoptysis. EXAM: PORTABLE CHEST 1 VIEW COMPARISON:  CT 04/02/2016. FINDINGS: Mediastinum hilar structures normal. Interim partial clearing of left lower lobe patchy infiltrates. No pleural effusion or pneumothorax. Heart size normal . IMPRESSION: Interim partial clearing of left lower lobe patchy pulmonary infiltrates. Electronically Signed   By: Maisie Fus  Register   On: 04/03/2016 07:25   Dg Chest Port 1 View  04/02/2016  CLINICAL DATA:  Hemoptysis for several days. EXAM: PORTABLE CHEST 1 VIEW COMPARISON:  None. FINDINGS: The heart size and mediastinal contours are within normal limits. Both lungs are clear. No evidence of pulmonary infiltrate or edema. No evidence of pleural effusion. Pulmonary hyperinflation,  suspicious for COPD. IMPRESSION: Probable COPD.  No active disease. Electronically Signed   By: Myles Rosenthal M.D.   On: 04/02/2016 10:43     ASSESSMENT AND PLAN:   45 year old male with past medical history of COPD, tobacco abuse who presents to the hospital with hemoptysis.  1. Hemoptysis-patient continues to have active hemoptysis. His hemoglobin is currently stable and he is hemodynamically stable. -Appreciate pulmonary consultation. Plan for possible bronchoscopy either today or tomorrow. -CT chest, chest x-ray showing evidence of multifocal pneumonia. No evidence of tuberculosis but await AFB 3.  2. Multifocal pneumonia-continue ceftriaxone, Zithromax. -Follow blood, sputum cultures.  3. COPD-mild acute exacerbation given the pneumonia. -Continue IV steroids, DuoNeb's, Pulmicort nebs.   All the records are reviewed and case discussed with Care Management/Social Workerr. Management plans discussed with the patient, family and they are in agreement.  CODE STATUS: Full  DVT Prophylaxis: Ambultory  TOTAL TIME TAKING CARE OF THIS PATIENT: 30 minutes.   POSSIBLE D/C IN 1-2 DAYS, DEPENDING ON CLINICAL CONDITION.   Houston Siren M.D on 04/03/2016 at 1:55 PM  Between 7am to 6pm - Pager - [phone]  After 6pm go to [email or web address] - password [email or web address] [facility]  [facility]  Office  [phone]  CC: Primary care physician; No PCP Per Patient

## 2016-04-03 NOTE — Progress Notes (Signed)
Negative pressure check completed and working.

## 2016-04-03 NOTE — Progress Notes (Signed)
Discussed w/ DR. Mungal. No plans for bronchoscopy now.  Will start patient on regular diet.

## 2016-04-03 NOTE — Progress Notes (Signed)
Pt noted coughing up large amounts of blood early in shift. Md notified as well as charge nurse, [facility], and ICU charge nurse. ICU nurse came to assess pt, reports patient stable. Pt denies any pain, Shortness of breath.  IV antibiotics given as ordered. Will continue to assess and monitor.

## 2016-04-03 NOTE — Progress Notes (Signed)
S: Patient seen and examined. Reports feeling better. No further hemoptysis. Currently on room air. She reports episodic cough with yellow sputum. Influenza PCR negative.  O: Blood pressure 113/84, pulse 61, temperature 96.8 F (36 C), temperature source Axillary, resp. rate 17, height 6' (1.829 m), weight 180 lb (81.647 kg), SpO2 94 %.  Assessment Hemoptysis-resolved Left Upper lobe multifocal pneumonia Acute COPD exacerbation  Plan  Patient will need pulmonology referral prior to discharge Continue solumedrol  Continue empiric antibiotics F/u cultures-sputum/AFB and HIV  Prn O2 Nebulized steroids and bronchodilator Patient is stable to transfer out of the ICU. Dr. Anne HahnWillis notified. Hospitalist will resume care today at 8 am.    Alroy BailiffMagdalene S. [facility] Hospitalukov ANP-BC Pulmonary and Critical Care Medicine [facility] Pager [phone]

## 2016-04-03 NOTE — Progress Notes (Signed)
Called report to [place], RN on 1A. Pt. VSS on room air, transported in a wheelchair.

## 2016-04-03 NOTE — Plan of Care (Signed)
Problem: Pain Managment: Goal: General experience of comfort will improve Outcome: Progressing Coughing episodes have improved, no blood noted since this am.

## 2016-04-04 ENCOUNTER — Telehealth: Payer: Self-pay | Admitting: *Deleted

## 2016-04-04 LAB — CBC
HCT: 40 % (ref 40.0–52.0)
Hemoglobin: 13.3 g/dL (ref 13.0–18.0)
MCH: 28.9 pg (ref 26.0–34.0)
MCHC: 33.3 g/dL (ref 32.0–36.0)
MCV: 86.8 fL (ref 80.0–100.0)
PLATELETS: 353 10*3/uL (ref 150–440)
RBC: 4.61 MIL/uL (ref 4.40–5.90)
RDW: 13.7 % (ref 11.5–14.5)
WBC: 21 10*3/uL — ABNORMAL HIGH (ref 3.8–10.6)

## 2016-04-04 LAB — CULTURE, RESPIRATORY W GRAM STAIN

## 2016-04-04 LAB — CULTURE, RESPIRATORY: SPECIAL REQUESTS: NORMAL

## 2016-04-04 MED ORDER — LEVOFLOXACIN 750 MG PO TABS
750.0000 mg | ORAL_TABLET | Freq: Every day | ORAL | Status: DC
Start: 2016-04-04 — End: 2022-11-14

## 2016-04-04 MED ORDER — PREDNISONE 20 MG PO TABS: 40.0000 mg | ORAL_TABLET | Freq: Every day | ORAL | Status: DC

## 2016-04-04 NOTE — Telephone Encounter (Signed)
-----   Message from Stephanie AcreVishal Mungal, MD sent at 04/03/2016  2:52 PM EDT ----- Regarding: HFU Please setup patient for HFU with me in next 1-2 weeks. This can be a 15min visit.   Thank you -VM

## 2016-04-04 NOTE — Progress Notes (Addendum)
[facility]    Heywood BeneKenneth Marcoe was admitted to the Hospital on 04/02/2016 and Discharged  04/04/2016 and should be excused from work/school   for 7  days starting 04/02/2016 , may return to work/school without any restrictions.  Call Hilda LiasVivek Sainani MD, [facility]  [phone] with questions.  Houston SirenSAINANI,VIVEK J M.D on 04/04/2016,at 12:02 PM

## 2016-04-04 NOTE — Progress Notes (Deleted)
Family requesting a CT or MRI of the right hip for patient. Dr Imogene Burnhen paged, waiting on callback.

## 2016-04-04 NOTE — Discharge Summary (Signed)
[facility] - [place] at [facility]   PATIENT NAME: Walter Pratt    MR#:  [id number]  DATE OF BIRTH:  1971-01-16  DATE OF ADMISSION:  04/02/2016 ADMITTING PHYSICIAN: Erin FullingKurian Kasa, MD  DATE OF DISCHARGE: 04/04/2016  PRIMARY CARE PHYSICIAN: No PCP Per Patient    ADMISSION DIAGNOSIS:  Hemoptysis [R04.2] Pneumonia involving left lung, unspecified part of lung [J18.9]  DISCHARGE DIAGNOSIS:  Active Problems:   Hemoptysis   SECONDARY DIAGNOSIS:  History reviewed. No pertinent past medical history. COPD  HOSPITAL COURSE:   45 year old male with past medical history of COPD, tobacco abuse who presents to the hospital with hemoptysis.  1. Hemoptysis-patient presented to the hospital with acute hemoptysis. Initially he was admitted to the ICU but did not require any BiPAP or ventilator support. He was watched in the intensive care unit for 24 hours and shortly thereafter transferred. -Patient was seen by pulmonary who did not think that the patient's hemoptysis was massive in nature. His hemoglobin remained stable. Most likely cause of his hemoptysis was probably a multifocal pneumonia. Patient was placed on IV steroids, broad-spectrum IV antibiotics. He AFB smear was negative. He is not having active hemoptysis and clinically feels much better. -He is being discharged on oral prednisone, oral Levaquin and follow-up with pulmonary as an outpatient.   2. Multifocal pneumonia-this was the cause of patient's shortness of breath and hemoptysis. Patient is not clinically afebrile, hemoptysis has stopped. He was hemodynamically stable. -He is being discharged on oral Levaquin for 10 days. Sputum culture did grow out Escherichia coli and its' Sensitive to fluoroquinolones.  3. COPD-mild acute exacerbation due to the pneumonia. -Patient was treated with IV steroids, DuoNeb's, Pulmicort nebs. He has significantly improved. He is no longer hypoxic and has no acute bronchospasm. He is  being discharged on oral prednisone taper and antibiotics as mentioned above.  DISCHARGE CONDITIONS:   Stable  CONSULTS OBTAINED:  Treatment Team:  Stephanie AcreVishal Mungal, MD  DRUG ALLERGIES:   Allergies  Allergen Reactions  . Penicillins Other (See Comments)    Childhood allergy. Has patient had a PCN reaction causing immediate rash, facial/tongue/throat swelling, SOB or lightheadedness with hypotension: No patient not sure Has patient had a PCN reaction causing severe rash involving mucus membranes or skin necrosis: No patient not sure Has patient had a PCN reaction that required hospitalization No patient not sure. Has patient had a PCN reaction occurring within the last 10 years: No If all of the above answer    DISCHARGE MEDICATIONS:   Current Discharge Medication List    START taking these medications   Details  levofloxacin (LEVAQUIN) 750 MG tablet Take 1 tablet (750 mg total) by mouth daily. Qty: 10 tablet, Refills: 0    predniSONE (DELTASONE) 20 MG tablet Take 2 tablets (40 mg total) by mouth daily with breakfast. Qty: 20 tablet, Refills: 0      CONTINUE these medications which have NOT CHANGED   Details  aspirin-acetaminophen-caffeine (EXCEDRIN MIGRAINE) 250-250-65 MG tablet Take 3 tablets by mouth 2 (two) times daily as needed for migraine.         DISCHARGE INSTRUCTIONS:   DIET:  Regular diet  DISCHARGE CONDITION:  Stable  ACTIVITY:  Activity as tolerated  OXYGEN:  Home Oxygen: No.   Oxygen Delivery: room air  DISCHARGE LOCATION:  home   If you experience worsening of your admission symptoms, develop shortness of breath, life threatening emergency, suicidal or homicidal thoughts you must seek medical attention immediately by calling  911 or calling your MD immediately  if symptoms less severe.  You Must read complete instructions/literature along with all the possible adverse reactions/side effects for all the Medicines you take and that have been  prescribed to you. Take any new Medicines after you have completely understood and accpet all the possible adverse reactions/side effects.   Please note  You were cared for by a hospitalist during your hospital stay. If you have any questions about your discharge medications or the care you received while you were in the hospital after you are discharged, you can call the unit and asked to speak with the hospitalist on call if the hospitalist that took care of you is not available. Once you are discharged, your primary care physician will handle any further medical issues. Please note that NO REFILLS for any discharge medications will be authorized once you are discharged, as it is imperative that you return to your primary care physician (or establish a relationship with a primary care physician if you do not have one) for your aftercare needs so that they can reassess your need for medications and monitor your lab values.     Today   No acute hemoptysis. No chest pain, shortness of breath. Feels much better.  VITAL SIGNS:  Blood pressure 118/78, pulse 82, temperature 98.6 F (37 C), temperature source Oral, resp. rate 18, height 6' (1.829 m), weight 79.833 kg (176 lb), SpO2 92 %.  I/O:   Intake/Output Summary (Last 24 hours) at 04/04/16 1403 Last data filed at 04/04/16 0857  Gross per 24 hour  Intake    940 ml  Output   1770 ml  Net   -830 ml    PHYSICAL EXAMINATION:  GENERAL:  45 y.o.-year-old patient lying in the bed with no acute distress.  EYES: Pupils equal, round, reactive to light and accommodation. No scleral icterus. Extraocular muscles intact.  HEENT: Head atraumatic, normocephalic. Oropharynx and nasopharynx clear.  NECK:  Supple, no jugular venous distention. No thyroid enlargement, no tenderness.  LUNGS: Normal breath sounds bilaterally, no wheezing, rales,rhonchi. No use of accessory muscles of respiration.  CARDIOVASCULAR: S1, S2 normal. No murmurs, rubs, or gallops.   ABDOMEN: Soft, non-tender, non-distended. Bowel sounds present. No organomegaly or mass.  EXTREMITIES: No pedal edema, cyanosis, or clubbing.  NEUROLOGIC: Cranial nerves II through XII are intact. No focal motor or sensory defecits b/l.  PSYCHIATRIC: The patient is alert and oriented x 3. Good affect.  SKIN: No obvious rash, lesion, or ulcer.   DATA REVIEW:   CBC  Recent Labs Lab 04/04/16 0525  WBC 21.0*  HGB 13.3  HCT 40.0  PLT 353    Chemistries   Recent Labs Lab 04/02/16 1156 04/03/16 0504  NA 137 140  K 3.8 4.3  CL 107 108  CO2 23 20*  GLUCOSE 92 106*  BUN 16 10  CREATININE 0.85 0.78  CALCIUM 8.5* 9.4  MG 1.8  --   AST 17  --   ALT 17  --   ALKPHOS 67  --   BILITOT 0.7  --     Cardiac Enzymes  Recent Labs Lab 04/02/16 1009  TROPONINI <0.03    Microbiology Results  Results for orders placed or performed during the hospital encounter of 04/02/16  MRSA PCR Screening     Status: None   Collection Time: 04/02/16 12:31 PM  Result Value Ref Range Status   MRSA by PCR NEGATIVE NEGATIVE Final    Comment:  The GeneXpert MRSA Assay (FDA approved for NASAL specimens only), is one component of a comprehensive MRSA colonization surveillance program. It is not intended to diagnose MRSA infection nor to guide or monitor treatment for MRSA infections.   Culture, expectorated sputum-assessment     Status: None   Collection Time: 04/02/16  2:28 PM  Result Value Ref Range Status   Specimen Description EXPECTORATED SPUTUM  Final   Special Requests Normal  Final   Sputum evaluation THIS SPECIMEN IS ACCEPTABLE FOR SPUTUM CULTURE  Final   Report Status 04/02/2016 FINAL  Final  Culture, respiratory (NON-Expectorated)     Status: None   Collection Time: 04/02/16  2:28 PM  Result Value Ref Range Status   Specimen Description EXPECTORATED SPUTUM  Final   Special Requests Normal Reflexed from [id number]  Final   Gram Stain   Final    RARE WBC SEEN FEW GRAM  POSITIVE COCCI IN CLUSTERS RARE GRAM NEGATIVE RODS    Culture MODERATE GROWTH ESCHERICHIA COLI  Final   Report Status 04/04/2016 FINAL  Final   Organism ID, Bacteria ESCHERICHIA COLI  Final      Susceptibility   Escherichia coli - MIC*    AMPICILLIN <=2 SENSITIVE Sensitive     CEFAZOLIN <=4 SENSITIVE Sensitive     CEFEPIME <=1 SENSITIVE Sensitive     CEFTAZIDIME <=1 SENSITIVE Sensitive     CEFTRIAXONE <=1 SENSITIVE Sensitive     CIPROFLOXACIN <=0.25 SENSITIVE Sensitive     GENTAMICIN <=1 SENSITIVE Sensitive     IMIPENEM <=0.25 SENSITIVE Sensitive     TRIMETH/SULFA <=20 SENSITIVE Sensitive     AMPICILLIN/SULBACTAM <=2 SENSITIVE Sensitive     PIP/TAZO <=4 SENSITIVE Sensitive     Extended ESBL NEGATIVE Sensitive     * MODERATE GROWTH ESCHERICHIA COLI    RADIOLOGY:  Dg Chest Port 1 View  04/03/2016  CLINICAL DATA:  Hemoptysis. EXAM: PORTABLE CHEST 1 VIEW COMPARISON:  CT 04/02/2016. FINDINGS: Mediastinum hilar structures normal. Interim partial clearing of left lower lobe patchy infiltrates. No pleural effusion or pneumothorax. Heart size normal . IMPRESSION: Interim partial clearing of left lower lobe patchy pulmonary infiltrates. Electronically Signed   By: Maisie Fus  Register   On: 04/03/2016 07:25      Management plans discussed with the patient, family and they are in agreement.  CODE STATUS:     Code Status Orders        Start     Ordered   04/02/16 1027  Full code   Continuous     04/02/16 1031    Code Status History    Date Active Date Inactive Code Status Order ID Comments User Context   This patient has a current code status but no historical code status.      TOTAL TIME TAKING CARE OF THIS PATIENT: 40 minutes.    Houston Siren M.D on 04/04/2016 at 2:03 PM  Between 7am to 6pm - Pager - [phone]  After 6pm go to [email or web address] - password [email or web address] [facility]  [facility] [place] [facility]  Office  [phone]  CC: Primary care physician; No PCP Per  Patient

## 2016-04-04 NOTE — Progress Notes (Signed)
Patient discharging home. Instructions and prescriptions given to patient, verbalize understanding. Wife at bedside providing transportation.

## 2016-04-04 NOTE — Telephone Encounter (Signed)
LMOM for pt to return call to schedule hosp f/u for hemoptysis.

## 2016-04-07 LAB — CULTURE, BLOOD (ROUTINE X 2)
Culture: NO GROWTH
Culture: NO GROWTH

## 2016-04-07 NOTE — Telephone Encounter (Signed)
Received message from wife stating to call back with an appt date and time. LMOM with appt date and time. Nothing further needed.

## 2016-04-07 NOTE — Telephone Encounter (Signed)
Received message from wife this am calling back to schedule pt's f/u appt. Tried to call wife back but had to LM. Will await call back.

## 2016-04-11 ENCOUNTER — Encounter: Payer: Self-pay | Admitting: Internal Medicine

## 2016-04-11 ENCOUNTER — Ambulatory Visit (INDEPENDENT_AMBULATORY_CARE_PROVIDER_SITE_OTHER): Payer: PRIVATE HEALTH INSURANCE | Admitting: Internal Medicine

## 2016-04-11 VITALS — BP 138/86 | HR 103 | Ht 72.0 in | Wt 179.0 lb

## 2016-04-11 DIAGNOSIS — Z720 Tobacco use: Secondary | ICD-10-CM | POA: Diagnosis not present

## 2016-04-11 DIAGNOSIS — J189 Pneumonia, unspecified organism: Secondary | ICD-10-CM | POA: Diagnosis not present

## 2016-04-11 DIAGNOSIS — R042 Hemoptysis: Secondary | ICD-10-CM

## 2016-04-11 NOTE — Progress Notes (Signed)
[facility] Pulmonary Medicine Consultation      MRN# [id number] Walter Pratt 06/23/1971   CC: Chief Complaint  Patient presents with  . Hospitalization Follow-up    SOB w/exertion day 2 back to work; dry cough at times;       Brief History: Synopsis: 45 year old male seen during hospitalization for multifocal pneumonia, mainly left-sided, along with intermittent hemoptysis. Significant smoking history and drinking history, now stop using tobacco and only tapering at times.  Patient is a 45 year old male seen in office today for follow-up visit of multifocal pneumonia and hemoptysis. Hospitalization as as stated below. Patient had intermittent episodes of hemoptysis prior to hospitalization and gradually became worse, along with some shortness of breath, necessitating hospitalization. During his brief hospitalization he was placed on broad-spectrum antibiotics and IV antibiotics, he was discharged on tapering antibiotics that she has completed today.  [facility] Hospitalization 34/23-4/25 45 year old male with past medical history of COPD, tobacco abuse who presents to the hospital with hemoptysis.  1. Hemoptysis-patient presented to the hospital with acute hemoptysis. Initially he was admitted to the ICU but did not require any BiPAP or ventilator support. He was watched in the intensive care unit for 24 hours and shortly thereafter transferred. -Patient was seen by pulmonary who did not think that the patient's hemoptysis was massive in nature. His hemoglobin remained stable. Most likely cause of his hemoptysis was probably a multifocal pneumonia. Patient was placed on IV steroids, broad-spectrum IV antibiotics. He AFB smear was negative. He is not having active hemoptysis and clinically feels much better. -He is being discharged on oral prednisone, oral Levaquin and follow-up with pulmonary as an outpatient.   2. Multifocal pneumonia-this was the cause of patient's shortness of breath and  hemoptysis. Patient is not clinically afebrile, hemoptysis has stopped. He was hemodynamically stable. -He is being discharged on oral Levaquin for 10 days. Sputum culture did grow out Escherichia coli and its' Sensitive to fluoroquinolones.  3. COPD-mild acute exacerbation due to the pneumonia. -Patient was treated with IV steroids, DuoNeb's, Pulmicort nebs. He has significantly improved. He is no longer hypoxic and has no acute bronchospasm. He is being discharged on oral prednisone taper and antibiotics as mentioned above.      Medication:   Current Outpatient Rx  Name  Route  Sig  Dispense  Refill  . aspirin-acetaminophen-caffeine (EXCEDRIN MIGRAINE) 250-250-65 MG tablet   Oral   Take 3 tablets by mouth 2 (two) times daily as needed for migraine.         Marland Kitchen levofloxacin (LEVAQUIN) 750 MG tablet   Oral   Take 1 tablet (750 mg total) by mouth daily.   10 tablet   0   . predniSONE (DELTASONE) 20 MG tablet   Oral   Take 2 tablets (40 mg total) by mouth daily with breakfast.   20 tablet   0      Review of Systems  Constitutional: Negative for fever and chills.  Eyes: Negative for blurred vision.  Respiratory: Positive for cough. Negative for hemoptysis, sputum production, shortness of breath and wheezing.   Cardiovascular: Negative for chest pain.  Gastrointestinal: Negative for heartburn.  Genitourinary: Negative for dysuria.  Musculoskeletal: Negative for myalgias.  Skin: Negative for itching and rash.  Neurological: Negative for dizziness and headaches.  Endo/Heme/Allergies: Does not bruise/bleed easily.  Psychiatric/Behavioral: Negative for depression.      Allergies:  Penicillins  Physical Examination:  VS: BP 138/86 mmHg  Pulse 103  Ht 6' (1.829 m)  Wt 179 lb (81.194 kg)  BMI 24.27 kg/m2  SpO2 98%  General Appearance: No distress  HEENT: PERRLA, no ptosis, no other lesions noticed Pulmonary:normal breath sounds., diaphragmatic excursion normal.No  wheezing, No rales   Cardiovascular:  Normal S1,S2.  No m/r/g.     Abdomen:Exam: Benign, Soft, non-tender, No masses  Skin:   warm, no rashes, no ecchymosis  Extremities: normal, no cyanosis, clubbing, warm with normal capillary refill.      Rad results: (The following images and results were reviewed by Dr. Dema SeverinMungal on 04/11/2016). CTA Chest 04/02/16 CT ANGIOGRAPHY CHEST WITH CONTRAST  TECHNIQUE: Multidetector CT imaging of the chest was performed using the standard protocol during bolus administration of intravenous contrast. Multiplanar CT image reconstructions and MIPs were obtained to evaluate the vascular anatomy.  CONTRAST: 75 mL Isovue 370 IV  COMPARISON: Chest radiograph dated 04/02/2016  FINDINGS: No evidence of pulmonary embolism.  Although not tailored for evaluation of the thoracic aorta, there is no evidence of thoracic aortic aneurysm or dissection.  Mediastinum/Nodes: The heart is normal in size. No pericardial effusion.  Mild atherosclerotic calcifications aortic arch.  No suspicious mediastinal lymphadenopathy.  Visualized thyroid is unremarkable.  Lungs/Pleura: Mild ground-glass nodular opacities in the lingula and left lower lobe, suspicious for pneumonia.  Underlying moderate centrilobular and paraseptal emphysematous changes, upper lobe predominant.  Biapical pleural-parenchymal scarring.  No suspicious pulmonary nodules.  No pleural effusion or pneumothorax.  Upper abdomen: Visualized upper abdomen is within normal limits.  Musculoskeletal: Mild degenerative changes of the visualized thoracolumbar spine.  Review of the MIP images confirms the above findings.  IMPRESSION: No evidence of pulmonary embolism.  Mild ground-glass nodular opacities in the lingula and left lower lobe, suspicious for multifocal pneumonia.      Assessment and Plan:44 yo seen in follow up for multifocal PNA and hemoptysis Pneumonia Recent  episode of multifocal pneumonia accompanied with hemoptysis Most likely a community required pneumonia which she was probably treated with antibiotics and steroids. Given his tobacco use and hemoptysis along with a pneumonia, repeat CAT scan after pneumonia is cleared to further evaluate for no other etiologies that could be causing the pneumonia, such as malignancy. Plan: -Repeat CAT scan without contrast prior to follow-up visit Tobacco avoidance Reduce alcohol consumption PFTs and 6mwt prior to follow up visit   Hemoptysis Most likely related to his multifocal pneumonia however other etiologies such as malignancy cannot be completely excluded. He is not currently having any further episodes of hemoptysis. Will plan for follow-up CT chest prior to follow-up visit. If CT chest is concerning or hemoptysis returns, we'll plan for bronchoscopy. The above was discussed with the patient and his wife will both in agreement.  Tobacco abuse Tobacco Cessation - Counseling regarding benefits of smoking cessation strategies was provided for more than 12 min. - Educated that at this time smoking- cessation represents the single most important step that patient can take to enhance the length and quality of live. - Educated patient regarding alternatives of behavior interventions, pharmacotherapy including NRT and non-nicotine therapy such, and combinations of both. - Patient at this time:quit tobacco but switched to vaping, advised patient to stop this also.      Updated Medication List Outpatient Encounter Prescriptions as of 04/11/2016  Medication Sig  . aspirin-acetaminophen-caffeine (EXCEDRIN MIGRAINE) 250-250-65 MG tablet Take 3 tablets by mouth 2 (two) times daily as needed for migraine.  Marland Kitchen. levofloxacin (LEVAQUIN) 750 MG tablet Take 1 tablet (750 mg total) by mouth daily.  . predniSONE (  DELTASONE) 20 MG tablet Take 2 tablets (40 mg total) by mouth daily with breakfast.   No  facility-administered encounter medications on file as of 04/11/2016.    Orders for this visit: Orders Placed This Encounter  Procedures  . CT Chest Wo Contrast    Standing Status: Future     Number of Occurrences:      Standing Expiration Date: 06/11/2017    Order Specific Question:  Reason for Exam (SYMPTOM  OR DIAGNOSIS REQUIRED)    Answer:  hemopytesis, pneumonia    Order Specific Question:  Preferred imaging location?    Answer:   [facility]  . Pulmonary function test    Standing Status: Future     Number of Occurrences:      Standing Expiration Date: 04/11/2017    Order Specific Question:  Where should this test be performed?    Answer:  [facility]    Order Specific Question:  6 minute walk    Answer:  Yes    Order Specific Question:  Diffusion capacity (DLCO)    Answer:  Yes    Order Specific Question:  Lung volumes    Answer:  Yes  . 6 minute walk    Standing Status: Future     Number of Occurrences:      Standing Expiration Date: 04/11/2017    Thank  you for the visitation and for allowing  [facility] to assist in the care of your patient. Our recommendations are noted above.  Please contact us if we can be of further service.  Stephanie Acre, MD [facility] Office Number: [phone]  Note: This note was prepared with Dragon dictation along with smaller phrase technology. Any transcriptional errors that result from this process are unintentional.

## 2016-04-11 NOTE — Assessment & Plan Note (Addendum)
Recent episode of multifocal pneumonia accompanied with hemoptysis Most likely a community required pneumonia which she was probably treated with antibiotics and steroids. Given his tobacco use and hemoptysis along with a pneumonia, repeat CAT scan after pneumonia is cleared to further evaluate for no other etiologies that could be causing the pneumonia, such as malignancy. Plan: -Repeat CAT scan without contrast prior to follow-up visit Tobacco avoidance Reduce alcohol consumption PFTs and prior to follow up visit

## 2016-04-11 NOTE — Patient Instructions (Signed)
Follow up with Dr. Dema SeverinMungal in:3 months - repeat CT Chest without contrast for hemoptysis and pneumonia - PFTs and 6mwt prior to follow up - cut back on smoking and eventually quit - tobacco quit set is 07/21/16 - cut back on daily alcohol use

## 2016-04-11 NOTE — Assessment & Plan Note (Signed)
Tobacco Cessation - Counseling regarding benefits of smoking cessation strategies was provided for more than 12 min. - Educated that at this time smoking- cessation represents the single most important step that patient can take to enhance the length and quality of live. - Educated patient regarding alternatives of behavior interventions, pharmacotherapy including NRT and non-nicotine therapy such, and combinations of both. - Patient at this time:quit tobacco but switched to vaping, advised patient to stop this also.

## 2016-04-11 NOTE — Assessment & Plan Note (Signed)
Most likely related to his multifocal pneumonia however other etiologies such as malignancy cannot be completely excluded. He is not currently having any further episodes of hemoptysis. Will plan for follow-up CT chest prior to follow-up visit. If CT chest is concerning or hemoptysis returns, we'll plan for bronchoscopy. The above was discussed with the patient and his wife will both in agreement.

## 2016-05-17 LAB — ACID FAST SMEAR (AFB, MYCOBACTERIA): Acid Fast Smear: NEGATIVE

## 2016-05-17 LAB — ACID FAST CULTURE WITH REFLEXED SENSITIVITIES: ACID FAST CULTURE - AFSCU3: NEGATIVE

## 2016-05-17 LAB — ACID FAST SMEAR (AFB)

## 2016-10-09 ENCOUNTER — Encounter: Payer: Self-pay | Admitting: Internal Medicine

## 2016-12-03 IMAGING — CT CT ANGIO CHEST
1 of 2 series · 18 of 30 positions shown · IV contrast (isovue)
Comparison: Chest radiograph dated 04/02/2016

CLINICAL DATA: Hemoptysis

EXAM:
CT ANGIOGRAPHY CHEST WITH CONTRAST
TECHNIQUE: Multidetector CT imaging of the chest was performed using the
standard protocol during bolus administration of intravenous
contrast. Multiplanar CT image reconstructions and MIPs were
obtained to evaluate the vascular anatomy.
CONTRAST:  75 mL Isovue 370 IV

[Series 5: pe 1.0 thins · axial · 0.68mm/px · z∈[-700,-424]mm · 18 of 313 slices shown]
[im 18/313  lung]
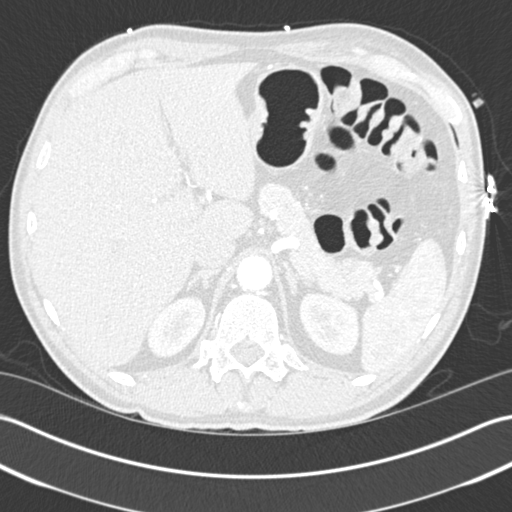
[im 35/313  mediastinal]
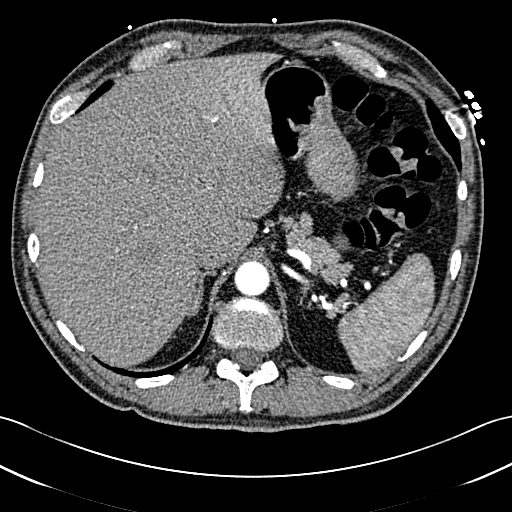
[im 53/313  lung]
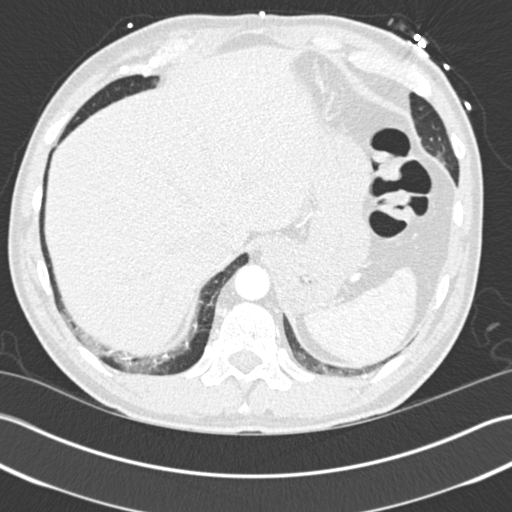
[im 70/313  mediastinal]
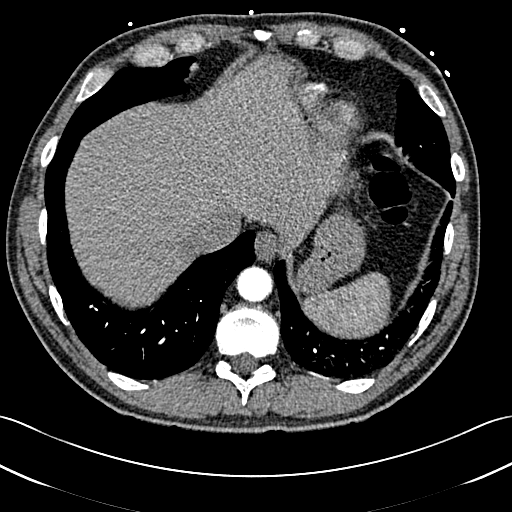
[im 87/313  lung]
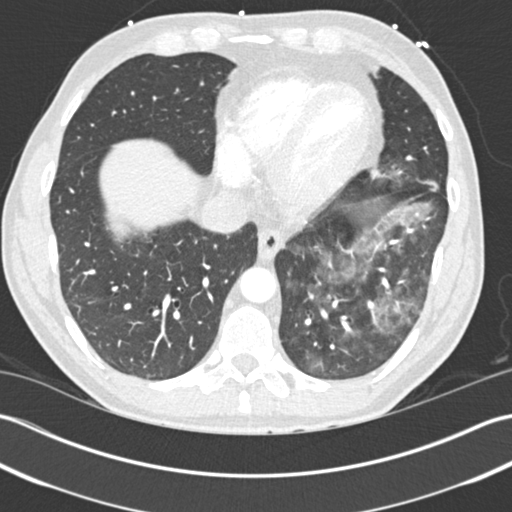
[im 105/313  mediastinal]
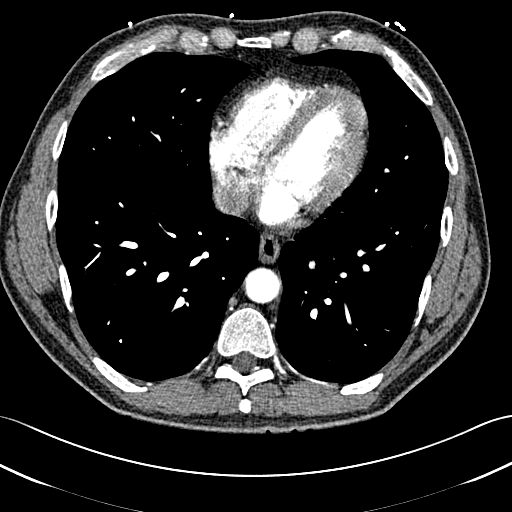
[im 122/313  lung]
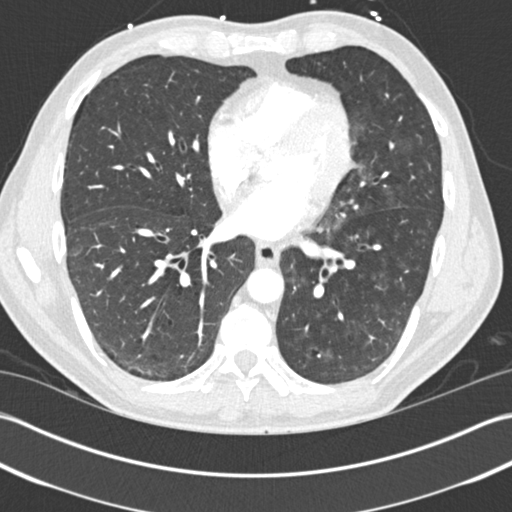
[im 139/313  mediastinal]
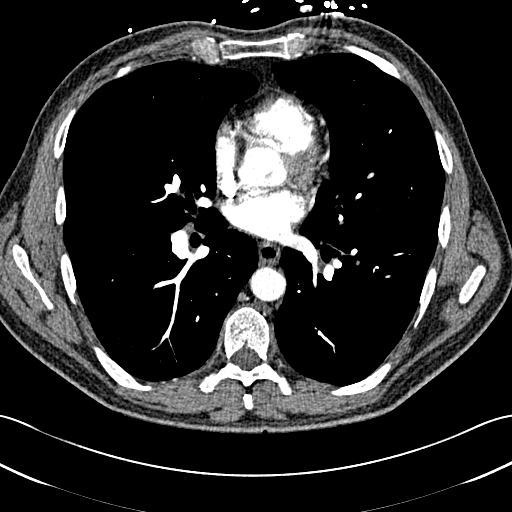
[im 147/313  lung]
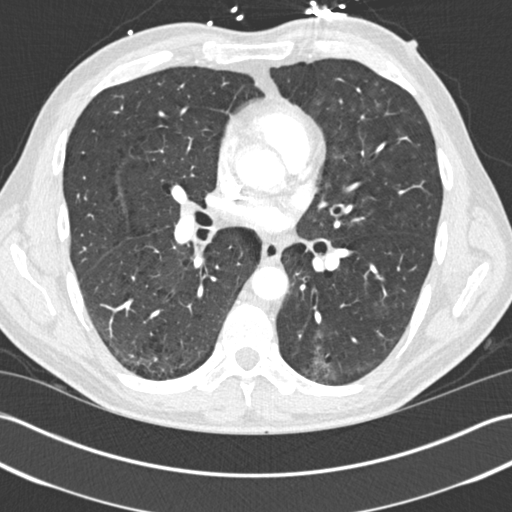
[im 157/313  mediastinal]
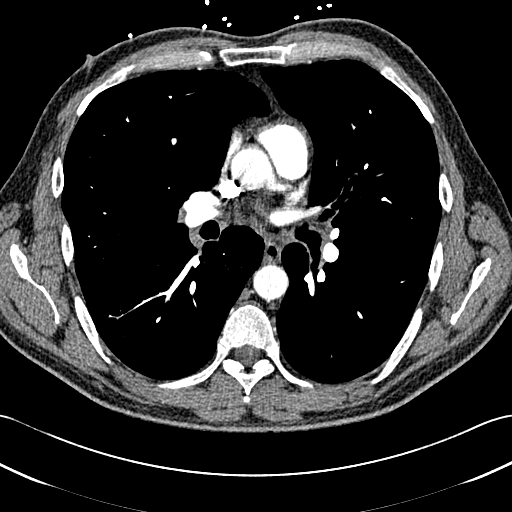
[im 174/313  lung]
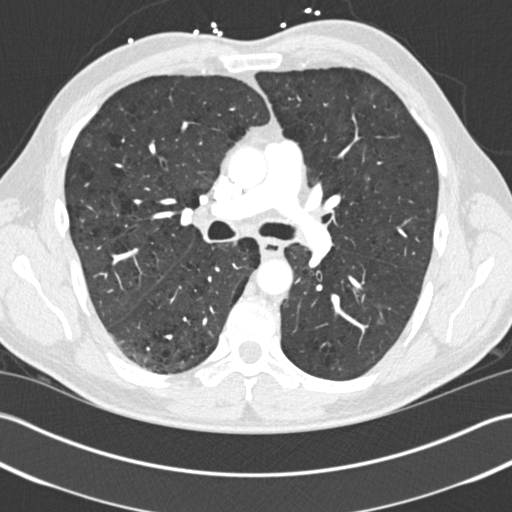
[im 191/313  mediastinal]
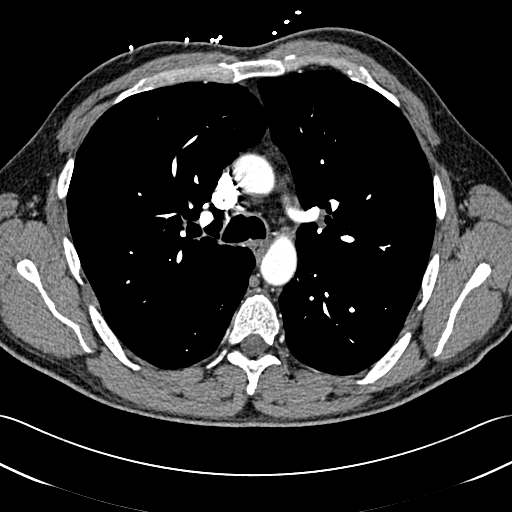
[im 209/313  lung]
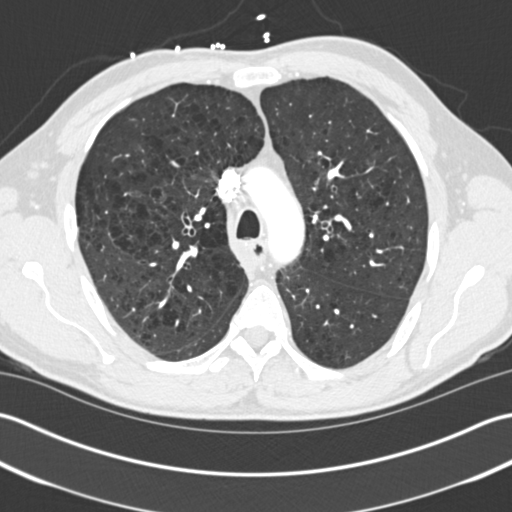
[im 226/313  mediastinal]
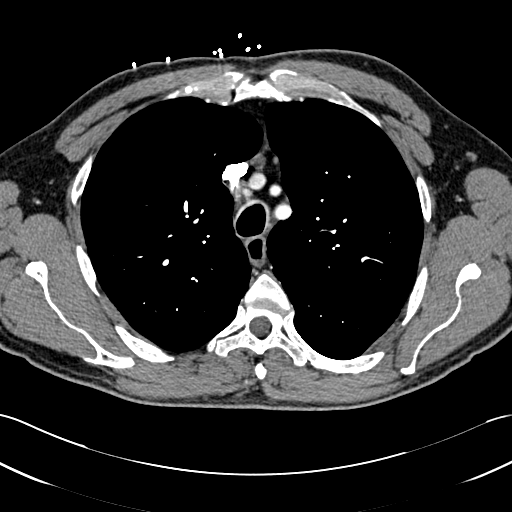
[im 243/313  lung]
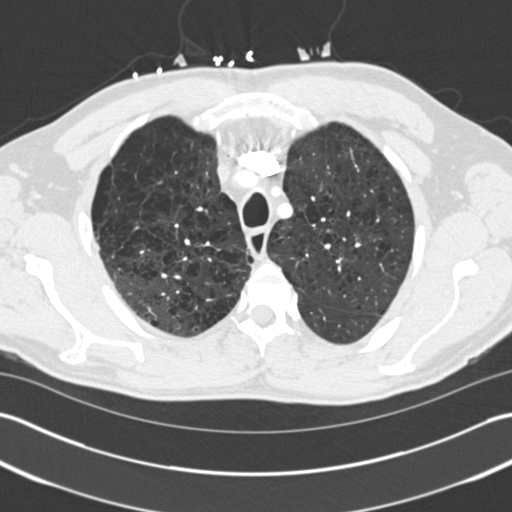
[im 261/313  mediastinal]
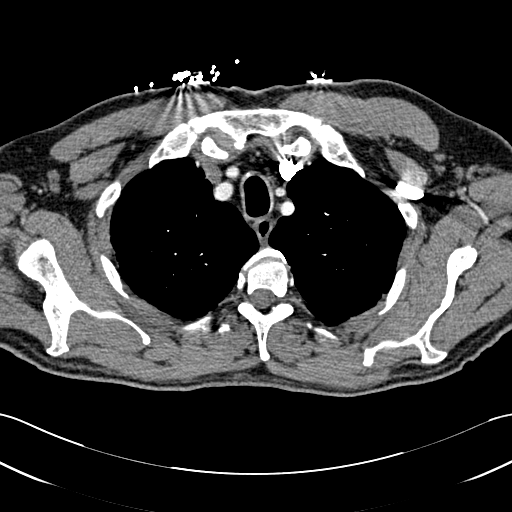
[im 278/313  lung]
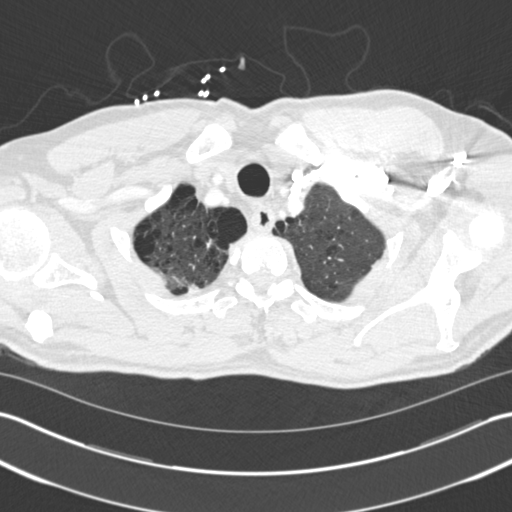
[im 295/313  mediastinal]
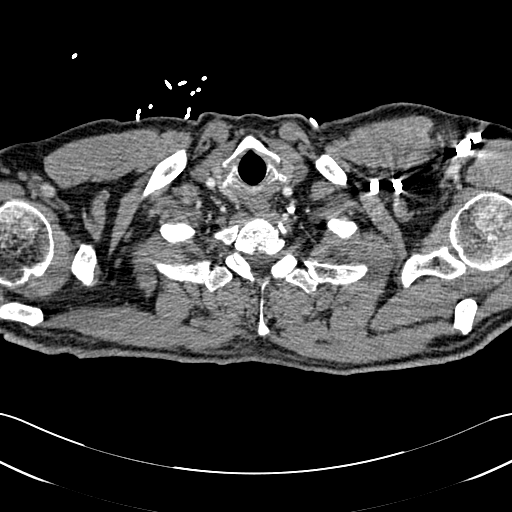

[18 of 30 positions shown; findings below may reference images not displayed]

FINDINGS: No evidence of pulmonary embolism.

Although not tailored for evaluation of the thoracic aorta, there is
no evidence of thoracic aortic aneurysm or dissection.

Mediastinum/Nodes: The heart is normal in size. No pericardial
effusion.

Mild atherosclerotic calcifications aortic arch.

No suspicious mediastinal lymphadenopathy.

Visualized thyroid is unremarkable.

Lungs/Pleura: Mild ground-glass nodular opacities in the lingula and
left lower lobe, suspicious for pneumonia.

Underlying moderate centrilobular and paraseptal emphysematous
changes, upper lobe predominant.

Biapical pleural-parenchymal scarring.

No suspicious pulmonary nodules.

No pleural effusion or pneumothorax.

Upper abdomen: Visualized upper abdomen is within normal limits.

Musculoskeletal: Mild degenerative changes of the visualized
thoracolumbar spine.

Review of the MIP images confirms the above findings.
IMPRESSION: No evidence of pulmonary embolism.

Mild ground-glass nodular opacities in the lingula and left lower
lobe, suspicious for multifocal pneumonia.

## 2016-12-04 IMAGING — DX DG CHEST 1V PORT
1 series · 2 of 2 positions shown · non-contrast
Comparison: CT 04/02/2016.

CLINICAL DATA: Hemoptysis.

EXAM:
PORTABLE CHEST 1 VIEW

[Series 1: chest ap · 0.14mm/px · 2 of 2 slices shown]
[im 1/2]
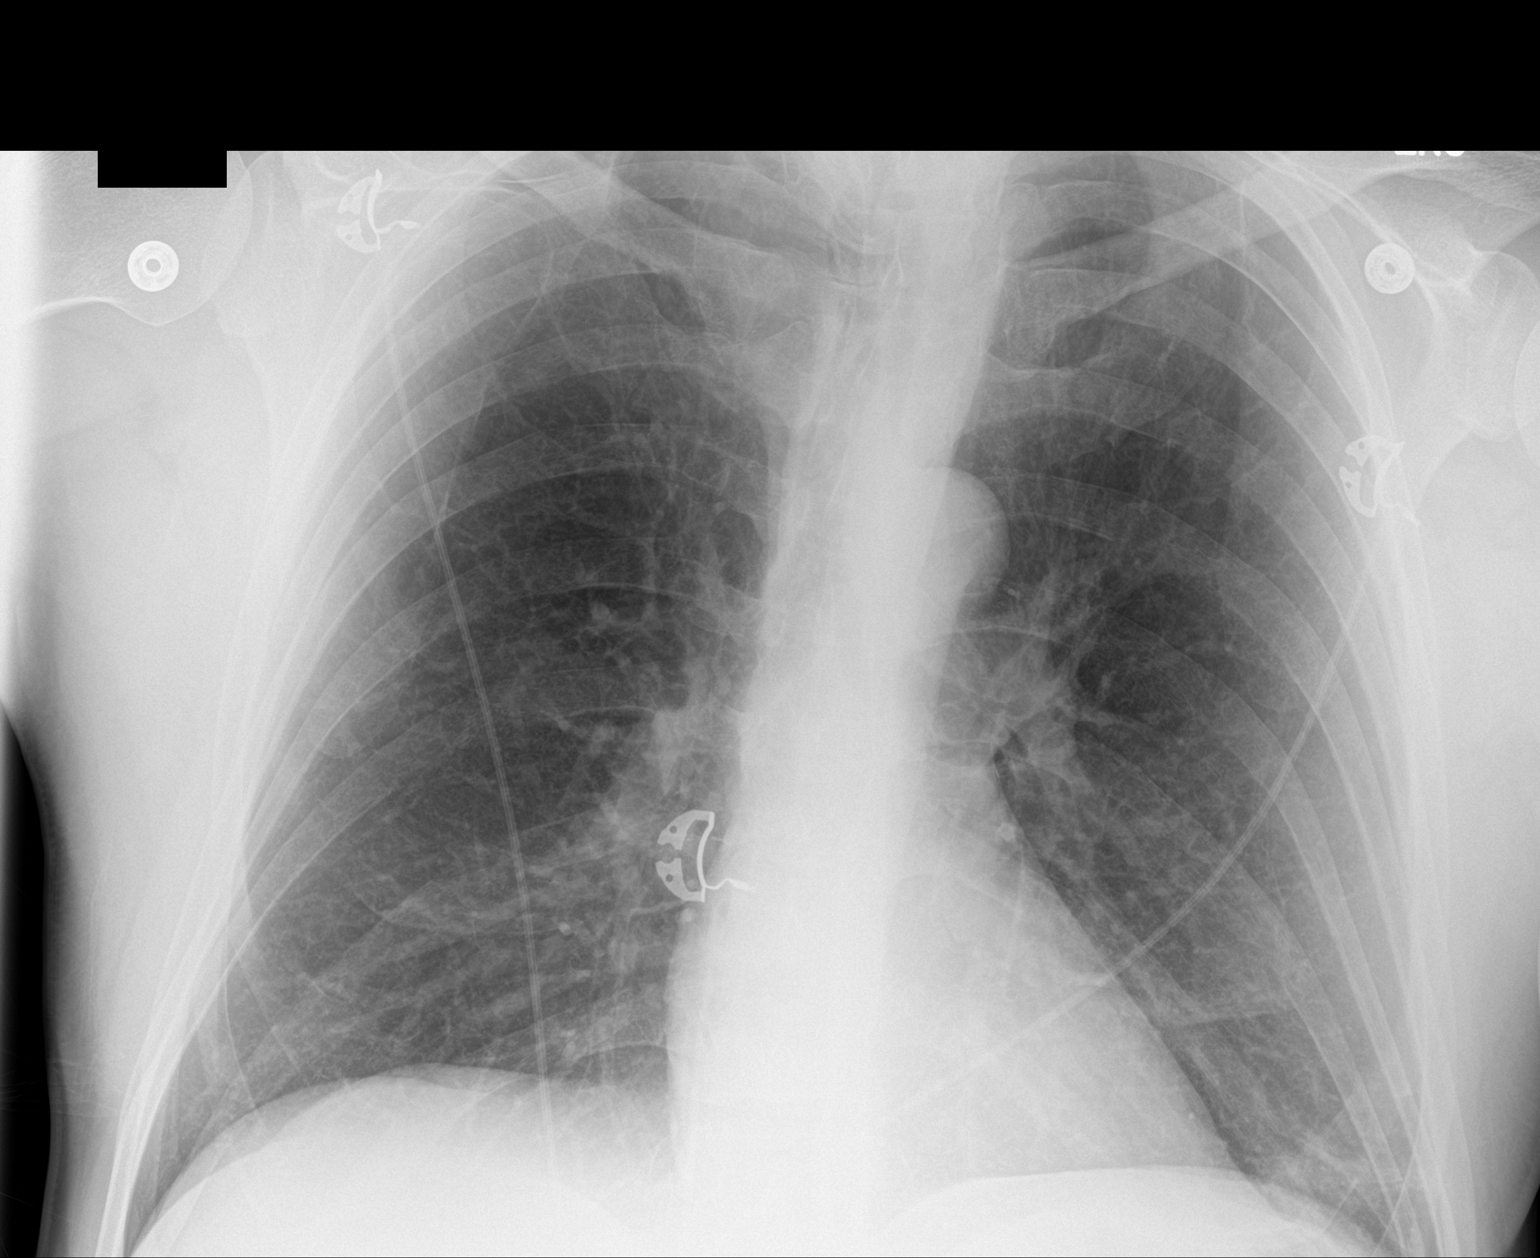
[im 2/2]
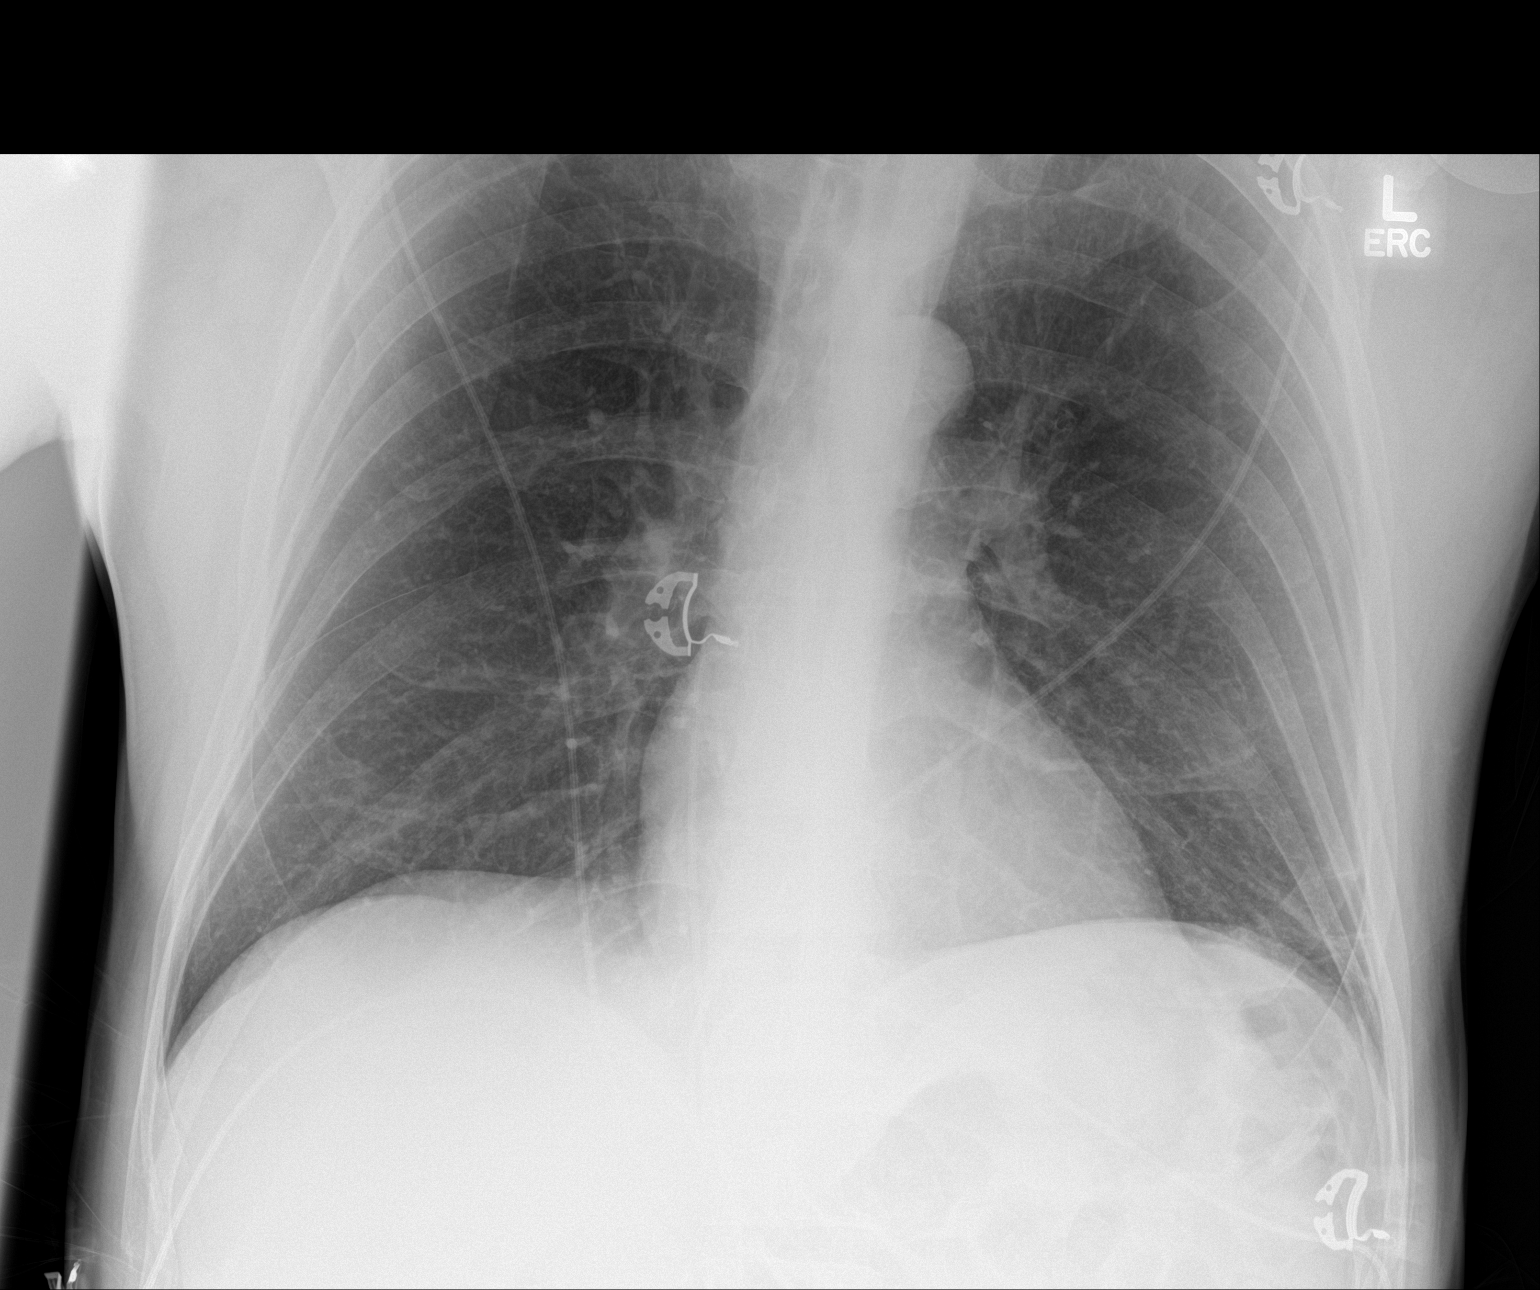

[2 of 2 positions shown; findings below may reference images not displayed]

FINDINGS: Mediastinum hilar structures normal. Interim partial clearing of
left lower lobe patchy infiltrates. No pleural effusion or
pneumothorax. Heart size normal .
IMPRESSION: Interim partial clearing of left lower lobe patchy pulmonary
infiltrates.

## 2018-03-01 ENCOUNTER — Telehealth: Payer: Self-pay | Admitting: *Deleted

## 2018-03-01 NOTE — Telephone Encounter (Signed)
-----   Message from Ronny BaconKatie C Welchel, [place] sent at 03/01/2018  4:39 PM EDT ----- Regarding: f/u apt If you ladies will please look into this patient as well; last seen 2017 by VM and was to be scheduled for CT Chest without contrast.  Thanks,  Florentina AddisonKatie

## 2018-03-01 NOTE — Telephone Encounter (Signed)
LMTCB

## 2021-12-11 HISTORY — PX: BACK SURGERY: SHX140

## 2022-11-14 ENCOUNTER — Emergency Department
Admission: EM | Admit: 2022-11-14 | Discharge: 2022-11-14 | Disposition: A | Discharge status: Home / Self Care | Payer: BC Managed Care – PPO | Attending: Emergency Medicine | Admitting: Emergency Medicine

## 2022-11-14 ENCOUNTER — Emergency Department: Payer: BC Managed Care – PPO

## 2022-11-14 ENCOUNTER — Other Ambulatory Visit: Payer: Self-pay

## 2022-11-14 ENCOUNTER — Encounter: Payer: Self-pay | Admitting: Emergency Medicine

## 2022-11-14 DIAGNOSIS — M5432 Sciatica, left side: Secondary | ICD-10-CM | POA: Diagnosis not present

## 2022-11-14 DIAGNOSIS — T148XXA Other injury of unspecified body region, initial encounter: Secondary | ICD-10-CM

## 2022-11-14 DIAGNOSIS — M79605 Pain in left leg: Secondary | ICD-10-CM | POA: Diagnosis not present

## 2022-11-14 DIAGNOSIS — M545 Low back pain, unspecified: Secondary | ICD-10-CM | POA: Diagnosis not present

## 2022-11-14 MED ORDER — METHOCARBAMOL 500 MG PO TABS: 500.0000 mg | ORAL_TABLET | Freq: Three times a day (TID) | ORAL | 0 refills | Status: DC | PRN

## 2022-11-14 MED ORDER — METHOCARBAMOL 500 MG PO TABS
500.0000 mg | ORAL_TABLET | Freq: Once | ORAL | Status: AC
  Administered 2022-11-14: 500 mg via ORAL
  Filled 2022-11-14: qty 1

## 2022-11-14 MED ORDER — PREDNISONE 50 MG PO TABS: 50.0000 mg | ORAL_TABLET | Freq: Every day | ORAL | 0 refills | Status: DC

## 2022-11-14 MED ORDER — NAPROXEN 500 MG PO TABS: 500.0000 mg | ORAL_TABLET | Freq: Two times a day (BID) | ORAL | 2 refills | Status: DC

## 2022-11-14 MED ORDER — KETOROLAC TROMETHAMINE 30 MG/ML IJ SOLN
30.0000 mg | Freq: Once | INTRAMUSCULAR | Status: AC
  Administered 2022-11-14: 30 mg via INTRAMUSCULAR
  Filled 2022-11-14: qty 1

## 2022-11-14 NOTE — ED Notes (Signed)
See triage note  Presents with pain to left leg  States he fell couple of days ago    Pain radiates from lower back into left leg

## 2022-11-14 NOTE — ED Notes (Signed)
Pt in chair, pt c/o L leg pain going from his buttocks down his L leg, denies trauma.

## 2022-11-14 NOTE — ED Provider Triage Note (Signed)
Emergency Medicine Provider Triage Evaluation Note  Walter Pratt , a 51 y.o. male  was evaluated in triage.  Pt complains of left leg pain.  Patient went down a step jarring the left leg.  Pain coming from the lower back all the way down the leg.  Took a in certain positions but worsens with standing and movement..  Review of Systems  Positive:  Negative:   Physical Exam  BP (!) 173/120 (BP Location: Right Arm)   Pulse 81   Temp 98 F (36.7 C)   Resp 18   Ht 6' (1.829 m)   Wt 83 kg   SpO2 98%   BMI 24.82 kg/m  Gen:   Awake, no distress   Resp:  Normal effort  MSK:   Moves extremities without difficulty, lower extremity nontender, lumbar spine tender Other:    Medical Decision Making  Medically screening exam initiated at 7:33 AM.  Appropriate orders placed.  Walter Pratt was informed that the remainder of the evaluation will be completed by another provider, this initial triage assessment does not replace that evaluation, and the importance of remaining in the ED until their evaluation is complete.  Stray lumbar spine   Faythe Ghee, PA-C 11/14/22 [phone]

## 2022-11-14 NOTE — ED Triage Notes (Signed)
Patient arrives in wheelchair by POV c/o left leg pain since Saturday. Patient reports while walking down the steps felt a pain in his left leg. Patient states pain starts in left hip and radiates behind thigh and down his calf.

## 2022-11-14 NOTE — ED Provider Notes (Signed)
[facility] Provider Note    Event Date/Time   First MD Initiated Contact with Patient 11/14/22 [phone]     (approximate)   History   Leg Pain   HPI  Walter Pratt is a 51 y.o. male who presents with complaints of left leg pain.  Patient reports 2 days ago he slipped walking on the steps and wrenched his left leg/buttock.  He was doing okay but reports over the last day he has had increasing pain radiating down his entire leg from his buttock.  He denies back pain.  Has not take anything for this.     Physical Exam   Triage Vital Signs: ED Triage Vitals  Enc Vitals Group     BP 11/14/22 0728 (!) 173/120     Pulse Rate 11/14/22 0728 81     Resp 11/14/22 0728 18     Temp 11/14/22 0728 98 F (36.7 C)     Temp Source 11/14/22 0832 Oral     SpO2 11/14/22 0728 98 %     Weight 11/14/22 0730 83 kg (183 lb)     Height 11/14/22 0730 1.829 m (6')     Head Circumference --      Peak Flow --      Pain Score 11/14/22 0729 10     Pain Loc --      Pain Edu? --      Excl. in GC? --     Most recent vital signs: Vitals:   11/14/22 0728 11/14/22 0832  BP: (!) 173/120 (!) 165/110  Pulse: 81 68  Resp: 18 17  Temp: 98 F (36.7 C) (!) 97.5 F (36.4 C)  SpO2: 98% 96%     General: Awake, no distress.  CV:  Good peripheral perfusion.  Resp:  Normal effort.  Abd:  No distention.  Other:  Left lower extremity exam is normal, warm and well perfused, no vertebral tenderness palpation painful to ambulate   ED Results / Procedures / Treatments   Labs (all labs ordered are listed, but only abnormal results are displayed) Labs Reviewed - No data to display   EKG     RADIOLOGY Lumbar x-ray viewed interpreted by me, no fracture    PROCEDURES:  Critical Care performed:   Procedures   MEDICATIONS ORDERED IN ED: Medications  ketorolac (TORADOL) 30 MG/ML injection 30 mg (30 mg Intramuscular Given 11/14/22 0828)  methocarbamol (ROBAXIN) tablet 500 mg  (500 mg Oral Given 11/14/22 0827)     IMPRESSION / MDM / ASSESSMENT AND PLAN / ED COURSE  I reviewed the triage vital signs and the nursing notes. Patient's presentation is most consistent with acute complicated illness / injury requiring diagnostic workup.  Patient presents after injury as above.  Differential includes muscle strain, sciatica, not consistent with arterial disease given reassuring exam  X-ray negative for bony abnormalities  Most consistent with sciatica, treated with IM Toradol, p.o. Robaxin, will start steroids, Ortho follow-up if no significant improvement in 1 week        FINAL CLINICAL IMPRESSION(S) / ED DIAGNOSES   Final diagnoses:  Sciatica of left side  Muscle strain     Rx / DC Orders   ED Discharge Orders          Ordered    methocarbamol (ROBAXIN) 500 MG tablet  Every 8 hours PRN        11/14/22 0818    predniSONE (DELTASONE) 50 MG tablet  Daily with breakfast  11/14/22 0818    naproxen (NAPROSYN) 500 MG tablet  2 times daily with meals        11/14/22 0818             Note:  This document was prepared using Dragon voice recognition software and may include unintentional dictation errors.   Jene Every, MD 11/14/22 [phone]

## 2022-11-14 NOTE — ED Notes (Signed)
Pt sitting in chair, pt states that he is ready to go home, pt verbalized understanding d/c and follow up, work note given, crutches given with instructions, pt demonstrated understanding, pt from dpt with sig other.

## 2022-11-21 ENCOUNTER — Other Ambulatory Visit: Payer: Self-pay | Admitting: Physician Assistant

## 2022-11-21 MED ORDER — PREDNISONE 20 MG PO TABS: ORAL_TABLET | ORAL | 0 refills | Status: AC

## 2022-12-01 ENCOUNTER — Other Ambulatory Visit: Payer: Self-pay | Admitting: Physician Assistant

## 2022-12-01 MED ORDER — PREDNISONE 20 MG PO TABS: ORAL_TABLET | ORAL | 0 refills | Status: AC

## 2022-12-13 ENCOUNTER — Ambulatory Visit: Payer: BC Managed Care – PPO | Admitting: Nurse Practitioner

## 2022-12-13 ENCOUNTER — Encounter: Payer: Self-pay | Admitting: Nurse Practitioner

## 2022-12-13 VITALS — BP 160/106 | HR 94 | Temp 97.4°F | Resp 14 | Ht 72.0 in | Wt 183.0 lb

## 2022-12-13 DIAGNOSIS — R030 Elevated blood-pressure reading, without diagnosis of hypertension: Secondary | ICD-10-CM | POA: Diagnosis not present

## 2022-12-13 DIAGNOSIS — M5442 Lumbago with sciatica, left side: Secondary | ICD-10-CM | POA: Diagnosis not present

## 2022-12-13 DIAGNOSIS — F17219 Nicotine dependence, cigarettes, with unspecified nicotine-induced disorders: Secondary | ICD-10-CM | POA: Diagnosis not present

## 2022-12-13 NOTE — Patient Instructions (Addendum)
We will call you with lab results and spine specialist appointment Rest, no heavy lifting or straining Monitor BP, keep log Follow-up 2- weeks for elevated BP, bring BP log  Blood Pressure Record Sheet To take your blood pressure, you will need a blood pressure machine. You may be prescribed one, or you can buy a blood pressure machine (blood pressure monitor) at your clinic, drug store, or online. When choosing one, look for these features: An automatic monitor that has an arm cuff. A cuff that wraps snugly, but not too tightly, around your upper arm. You should be able to fit only one finger between your arm and the cuff. A device that stores blood pressure reading results. Do not choose a monitor that measures your blood pressure from your wrist or finger. Follow your health care provider's instructions for how to take your blood pressure. To use this form: Get one reading in the morning (a.m.) before you take any medicines. Get one reading in the evening (p.m.) before supper. Take at least two readings with each blood pressure check. This makes sure the results are correct. Wait 1-2 minutes between measurements. Write down the results in the spaces on this form. Repeat this once a week, or as told by your health care provider. Make a follow-up appointment with your health care provider to discuss the results. Blood pressure log Date: _______________________ a.m. _____________________(1st reading) _____________________(2nd reading) p.m. _____________________(1st reading) _____________________(2nd reading) Date: _______________________ a.m. _____________________(1st reading) _____________________(2nd reading) p.m. _____________________(1st reading) _____________________(2nd reading) Date: _______________________ a.m. _____________________(1st reading) _____________________(2nd reading) p.m. _____________________(1st reading) _____________________(2nd reading) Date:  _______________________ a.m. _____________________(1st reading) _____________________(2nd reading) p.m. _____________________(1st reading) _____________________(2nd reading) Date: _______________________ a.m. _____________________(1st reading) _____________________(2nd reading) p.m. _____________________(1st reading) _____________________(2nd reading) This information is not intended to replace advice given to you by your health care provider. Make sure you discuss any questions you have with your health care provider. Document Revised: 08/11/2021 Document Reviewed: 08/11/2021 Elsevier Patient Education  [employer].   Acute Back Pain, Adult Acute back pain is sudden and usually short-lived. It is often caused by an injury to the muscles and tissues in the back. The injury may result from: A muscle, tendon, or ligament getting overstretched or torn. Ligaments are tissues that connect bones to each other. Lifting something improperly can cause a back strain. Wear and tear (degeneration) of the spinal disks. Spinal disks are circular tissue that provide cushioning between the bones of the spine (vertebrae). Twisting motions, such as while playing sports or doing yard work. A hit to the back. Arthritis. You may have a physical exam, lab tests, and imaging tests to find the cause of your pain. Acute back pain usually goes away with rest and home care. Follow these instructions at home: Managing pain, stiffness, and swelling Take over-the-counter and prescription medicines only as told by your health care provider. Treatment may include medicines for pain and inflammation that are taken by mouth or applied to the skin, or muscle relaxants. Your health care provider may recommend applying ice during the first 24-48 hours after your pain starts. To do this: Put ice in a plastic bag. Place a towel between your skin and the bag. Leave the ice on for 20 minutes, 2-3 times a day. Remove the ice  if your skin turns bright red. This is very important. If you cannot feel pain, heat, or cold, you have a greater risk of damage to the area. If directed, apply heat to the affected area as often as  told by your health care provider. Use the heat source that your health care provider recommends, such as a moist heat pack or a heating pad. Place a towel between your skin and the heat source. Leave the heat on for 20-30 minutes. Remove the heat if your skin turns bright red. This is especially important if you are unable to feel pain, heat, or cold. You have a greater risk of getting burned. Activity  Do not stay in bed. Staying in bed for more than 1-2 days can delay your recovery. Sit up and stand up straight. Avoid leaning forward when you sit or hunching over when you stand. If you work at a desk, sit close to it so you do not need to lean over. Keep your chin tucked in. Keep your neck drawn back, and keep your elbows bent at a 90-degree angle (right angle). Sit high and close to the steering wheel when you drive. Add lower back (lumbar) support to your car seat, if needed. Take short walks on even surfaces as soon as you are able. Try to increase the length of time you walk each day. Do not sit, drive, or stand in one place for more than 30 minutes at a time. Sitting or standing for long periods of time can put stress on your back. Do not drive or use heavy machinery while taking prescription pain medicine. Use proper lifting techniques. When you bend and lift, use positions that put less stress on your back: [place] your knees. Keep the load close to your body. Avoid twisting. Exercise regularly as told by your health care provider. Exercising helps your back heal faster and helps prevent back injuries by keeping muscles strong and flexible. Work with a physical therapist to make a safe exercise program, as recommended by your health care provider. Do any exercises as told by your physical  therapist. Lifestyle Maintain a healthy weight. Extra weight puts stress on your back and makes it difficult to have good posture. Avoid activities or situations that make you feel anxious or stressed. Stress and anxiety increase muscle tension and can make back pain worse. Learn ways to manage anxiety and stress, such as through exercise. General instructions Sleep on a firm mattress in a comfortable position. Try lying on your side with your knees slightly bent. If you lie on your back, put a pillow under your knees. Keep your head and neck in a straight line with your spine (neutral position) when using electronic equipment like smartphones or pads. To do this: Raise your smartphone or pad to look at it instead of bending your head or neck to look down. Put the smartphone or pad at the level of your face while looking at the screen. Follow your treatment plan as told by your health care provider. This may include: Cognitive or behavioral therapy. Acupuncture or massage therapy. Meditation or yoga. Contact a health care provider if: You have pain that is not relieved with rest or medicine. You have increasing pain going down into your legs or buttocks. Your pain does not improve after 2 weeks. You have pain at night. You lose weight without trying. You have a fever or chills. You develop nausea or vomiting. You develop abdominal pain. Get help right away if: You develop new bowel or bladder control problems. You have unusual weakness or numbness in your arms or legs. You feel faint. These symptoms may represent a serious problem that is an emergency. Do not wait to see if the  symptoms will go away. Get medical help right away. Call your local emergency services (911 in the [place] Do not drive yourself to the hospital. Summary Acute back pain is sudden and usually short-lived. Use proper lifting techniques. When you bend and lift, use positions that put less stress on your back. Take  over-the-counter and prescription medicines only as told by your health care provider, and apply heat or ice as told. This information is not intended to replace advice given to you by your health care provider. Make sure you discuss any questions you have with your health care provider. Document Revised: 02/18/2021 Document Reviewed: 02/18/2021 Elsevier Patient Education  2023 [employer].

## 2022-12-13 NOTE — Progress Notes (Signed)
Subjective:  Patient ID: Walter Pratt, male    DOB: 03/16/1971  Age: 52 y.o. MRN: [id number]  Chief Complaint  Patient presents with   Back Pain    HPI  Pt presents to establish care and evaluation of acute left lower back pain. Pt is a long time cigarette smoker. Denies any chronic medical problems. BP elevated at 160/106. Pt denies history of hypertension, cp, dyspnea, or dizziness. States back pain began 6 weeks ago. He was evaluated at ED on 11/14/22. Lumbar x-ray negative for fracture, pt treated with a course of Prednisone, Robaxin, and Aleve. States he has taken three courses of steroids in the past 6 weeks with minimal improvement and return of severe back pain after completing steroids. States pain improves with steroids but returns after completing medication.   Back Pain  He reports new onset back pain. There was not an injury that may have caused the pain. The most recent episode started about 6 weeks ago and is staying constant. The pain is located in the left lumbar areato the left foot. It is described as is 10/10 in intensity, occurring intermittently. Symptoms are worse in the: morning  Aggravating factors: standing Relieving factors: sitting. States he has performed back stretching exercising at home. He has been unable to return to his job due to severe pain.  He has tried application of heat, application of ice, NSAIDs, oral steroids, topical anesthetics (Tiger Balm)and massage therapy with little relief.   Associated symptoms: No abdominal pain No bowel incontinence  No chest pain No dysuria   No fever Yes headaches  Yes joint pains Yes pelvic pain  Yes weakness in leg  Yes tingling in lower extremities  Yes urinary incontinence No weight loss     Patient is here for low back pain on both sides with radiation to both legs . Patient states of pain is score 10 when he walks and 7-8 when he is sitting down.   He also mentioned that last night, he closed his eyes and  his face was numb and his tongue was numb too.  Current Outpatient Medications on File Prior to Visit  Medication Sig Dispense Refill   methocarbamol (ROBAXIN) 500 MG tablet Take 1 tablet (500 mg total) by mouth every 8 (eight) hours as needed for muscle spasms. 20 tablet 0   naproxen (NAPROSYN) 500 MG tablet Take 1 tablet (500 mg total) by mouth 2 (two) times daily with a meal. 20 tablet 2   No current facility-administered medications on file prior to visit.   No past medical history on file. No past surgical history on file.  Family History  Problem Relation Age of Onset   Lung cancer Mother    COPD Father    Social History   Socioeconomic History   Marital status: Married    Spouse name: Not on file   Number of children: Not on file   Years of education: Not on file   Highest education level: Not on file  Occupational History   Not on file  Tobacco Use   Smoking status: Former    Packs/day: 0.00    Types: Cigarettes   Smokeless tobacco: Not on file  Substance and Sexual Activity   Alcohol use: Yes    Comment: daily use.  Last drank 6 pack beer and 2-3 shots of liqour   Drug use: Not on file   Sexual activity: Not on file  Other Topics Concern   Not on file  Social  History Narrative   Not on file   Social Determinants of Health   Financial Resource Strain: Not on file  Food Insecurity: Not on file  Transportation Needs: Not on file  Physical Activity: Not on file  Stress: Not on file  Social Connections: Not on file    Review of Systems  Constitutional:  Negative for chills, fatigue, fever and unexpected weight change.  HENT:  Negative for congestion, ear pain, sinus pain and sore throat.   Respiratory:  Negative for cough.   Cardiovascular:  Negative for chest pain and palpitations.  Gastrointestinal:  Negative for abdominal pain, blood in stool, constipation, diarrhea, nausea and vomiting.  Endocrine: Negative for polydipsia.  Genitourinary:  Negative  for dysuria.  Musculoskeletal:  Positive for back pain and gait problem.  Skin:  Negative for rash.  Neurological:  Positive for numbness. Negative for headaches.     Objective:  BP (!) 160/106   Pulse 94   Temp (!) 97.4 F (36.3 C)   Resp 14   Ht 6' (1.829 m)   Wt 183 lb (83 kg)   SpO2 97%   BMI 24.82 kg/m       11/14/2022    8:32 AM 11/14/2022    7:30 AM 11/14/2022    7:28 AM  BP/Weight  Systolic BP 242  683  Diastolic BP 419  622  Wt. (Lbs)  183   BMI  24.82 kg/m2     Physical Exam Vitals reviewed.  Constitutional:      Appearance: He is ill-appearing (facial grimacing, ambulating with crutch).  Musculoskeletal:        General: Tenderness present.     Cervical back: Normal.     Thoracic back: Normal.     Lumbar back: Spasms and tenderness present. Decreased range of motion. Positive left straight leg raise test. Negative right straight leg raise test.     Right lower leg: No edema.     Left lower leg: No edema.  Neurological:     Mental Status: He is alert.     Lab Results  Component Value Date   WBC 21.0 (H) 04/04/2016   HGB 13.3 04/04/2016   HCT 40.0 04/04/2016   PLT 353 04/04/2016   GLUCOSE 106 (H) 04/03/2016   ALT 17 04/02/2016   AST 17 04/02/2016   NA 140 04/03/2016   K 4.3 04/03/2016   CL 108 04/03/2016   CREATININE 0.78 04/03/2016   BUN 10 04/03/2016   CO2 20 (L) 04/03/2016   INR 0.92 04/02/2016      Assessment & Plan:   1. Acute left-sided low back pain with left-sided sciatica - CBC with Differential/Platelet - Comprehensive metabolic panel - T4, free - TSH - MR Lumbar Spine W Wo Contrast; Future - Ambulatory referral to Spine Surgery  2. Elevated blood pressure reading in office without diagnosis of hypertension - CBC with Differential/Platelet - Comprehensive metabolic panel - T4, free - TSH  3. Cigarette nicotine dependence with nicotine-induced disorder - CBC with Differential/Platelet - Comprehensive metabolic panel    We will call you with lab results and spine specialist appointment Rest, no heavy lifting or straining Monitor BP, keep log Follow-up 2- weeks for elevated BP, bring BP log     Follow-up: 2 weeks  An After Visit Summary was printed and given to the patient.  I, Rip Harbour, NP, have reviewed all documentation for this visit. The documentation on 12/13/22 for the exam, diagnosis, procedures, and orders are all accurate  and complete.   Signed, Janie Morning, NP [facility] [phone]

## 2022-12-14 ENCOUNTER — Other Ambulatory Visit: Payer: Self-pay | Admitting: Nurse Practitioner

## 2022-12-14 DIAGNOSIS — I10 Essential (primary) hypertension: Secondary | ICD-10-CM

## 2022-12-14 LAB — COMPREHENSIVE METABOLIC PANEL
ALT: 22 IU/L (ref 0–44)
AST: 13 IU/L (ref 0–40)
Albumin/Globulin Ratio: 1.8 (ref 1.2–2.2)
Albumin: 4.3 g/dL (ref 3.8–4.9)
Alkaline Phosphatase: 74 IU/L (ref 44–121)
BUN/Creatinine Ratio: 11 (ref 9–20)
BUN: 10 mg/dL (ref 6–24)
Bilirubin Total: 0.4 mg/dL (ref 0.0–1.2)
CO2: 21 mmol/L (ref 20–29)
Calcium: 9.5 mg/dL (ref 8.7–10.2)
Chloride: 99 mmol/L (ref 96–106)
Creatinine, Ser: 0.87 mg/dL (ref 0.76–1.27)
Globulin, Total: 2.4 g/dL (ref 1.5–4.5)
Glucose: 107 mg/dL — ABNORMAL HIGH (ref 70–99)
Potassium: 4.1 mmol/L (ref 3.5–5.2)
Sodium: 136 mmol/L (ref 134–144)
Total Protein: 6.7 g/dL (ref 6.0–8.5)
eGFR: 104 mL/min/{1.73_m2} (ref >59)

## 2022-12-14 LAB — CBC WITH DIFFERENTIAL/PLATELET
Basophils Absolute: 0.1 10*3/uL (ref 0.0–0.2)
Basos: 1 %
EOS (ABSOLUTE): 0.4 10*3/uL (ref 0.0–0.4)
Eos: 3 %
Hematocrit: 51.9 % — ABNORMAL HIGH (ref 37.5–51.0)
Hemoglobin: 17.3 g/dL (ref 13.0–17.7)
Immature Grans (Abs): 0.3 10*3/uL — ABNORMAL HIGH (ref 0.0–0.1)
Immature Granulocytes: 2 %
Lymphocytes Absolute: 4 10*3/uL — ABNORMAL HIGH (ref 0.7–3.1)
Lymphs: 23 %
MCH: 28.2 pg (ref 26.6–33.0)
MCHC: 33.3 g/dL (ref 31.5–35.7)
MCV: 85 fL (ref 79–97)
Monocytes Absolute: 0.9 10*3/uL (ref 0.1–0.9)
Monocytes: 5 %
Neutrophils Absolute: 11.7 10*3/uL — ABNORMAL HIGH (ref 1.4–7.0)
Neutrophils: 66 %
Platelets: 364 10*3/uL (ref 150–450)
RBC: 6.14 x10E6/uL — ABNORMAL HIGH (ref 4.14–5.80)
RDW: 13.9 % (ref 11.6–15.4)
WBC: 17.3 10*3/uL — ABNORMAL HIGH (ref 3.4–10.8)

## 2022-12-14 LAB — TSH: TSH: 1.31 u[IU]/mL (ref 0.450–4.500)

## 2022-12-14 LAB — T4, FREE: Free T4: 1.47 ng/dL (ref 0.82–1.77)

## 2022-12-14 MED ORDER — VALSARTAN 40 MG PO TABS: 40.0000 mg | ORAL_TABLET | Freq: Every day | ORAL | 3 refills | Status: DC

## 2022-12-15 ENCOUNTER — Telehealth: Payer: Self-pay | Admitting: Nurse Practitioner

## 2022-12-18 ENCOUNTER — Other Ambulatory Visit: Payer: Self-pay | Admitting: Physician Assistant

## 2022-12-18 ENCOUNTER — Other Ambulatory Visit: Payer: Self-pay

## 2022-12-18 ENCOUNTER — Ambulatory Visit
Admission: RE | Admit: 2022-12-18 | Discharge: 2022-12-18 | Disposition: A | Discharge status: Home / Self Care | Payer: BC Managed Care – PPO | Source: Ambulatory Visit | Attending: Nurse Practitioner | Admitting: Nurse Practitioner

## 2022-12-18 DIAGNOSIS — M5442 Lumbago with sciatica, left side: Secondary | ICD-10-CM | POA: Insufficient documentation

## 2022-12-18 DIAGNOSIS — M5126 Other intervertebral disc displacement, lumbar region: Secondary | ICD-10-CM | POA: Diagnosis not present

## 2022-12-18 MED ORDER — GADOBUTROL 1 MMOL/ML IV SOLN
8.0000 mL | Freq: Once | INTRAVENOUS | Status: AC | PRN
  Administered 2022-12-18: 8 mL via INTRAVENOUS

## 2022-12-19 DIAGNOSIS — Z6825 Body mass index (BMI) 25.0-25.9, adult: Secondary | ICD-10-CM | POA: Diagnosis not present

## 2022-12-19 DIAGNOSIS — M5126 Other intervertebral disc displacement, lumbar region: Secondary | ICD-10-CM | POA: Diagnosis not present

## 2022-12-19 DIAGNOSIS — M5416 Radiculopathy, lumbar region: Secondary | ICD-10-CM | POA: Diagnosis not present

## 2022-12-28 ENCOUNTER — Ambulatory Visit: Payer: BC Managed Care – PPO | Admitting: Nurse Practitioner

## 2022-12-28 ENCOUNTER — Encounter: Payer: Self-pay | Admitting: Nurse Practitioner

## 2022-12-28 VITALS — BP 142/101 | HR 88 | Temp 97.4°F | Ht 73.0 in | Wt 186.0 lb

## 2022-12-28 DIAGNOSIS — M48061 Spinal stenosis, lumbar region without neurogenic claudication: Secondary | ICD-10-CM | POA: Diagnosis not present

## 2022-12-28 DIAGNOSIS — M5416 Radiculopathy, lumbar region: Secondary | ICD-10-CM | POA: Diagnosis not present

## 2022-12-28 DIAGNOSIS — I10 Essential (primary) hypertension: Secondary | ICD-10-CM | POA: Diagnosis not present

## 2022-12-28 MED ORDER — VALSARTAN 80 MG PO TABS: 80.0000 mg | ORAL_TABLET | Freq: Every day | ORAL | 3 refills | Status: DC

## 2022-12-28 NOTE — Progress Notes (Signed)
Subjective:  Patient ID: Walter Pratt, male    DOB: Feb 02, 1971  Age: 52 y.o. MRN: [id number]  Chief Complaint  Patient presents with   Hypertension    HPI Pt presents for follow-up of newly diagnosed HTN. He was recently found to have severe spinal stenosis of L4-L5 with nerve root impingement per MRI on 12/18/22. He is scheduled with spine surgery with Dr Ronnald Ramp in four days. Pt states he has experienced pain, anxiety, and insomnia related to back injury and pending surgery. He has been instructed to hold Ibuprofen due to surgery. Hypertension, follow-up: He was last seen for hypertension 2 weeks ago.  BP at that visit was 160/106. Management includes valsartan 40 mg daily.  He reports excellent compliance with treatment. He is not having side effects.  He is following a Regular diet. He is not exercising. He does smoke.  Use of agents associated with hypertension: none.   Outside blood pressures are 124/101, 131/94, 153/97, 127/94, 128/97, 140/94, 123/84, 126/98.  Pertinent labs: No results found for: "CHOL", "HDL", "[place]", "LDLDIRECT", "TRIG", "CHOLHDL" Lab Results  Component Value Date   NA 136 12/13/2022   K 4.1 12/13/2022   CREATININE 0.87 12/13/2022   EGFR 104 12/13/2022   GFRNONAA >60 04/03/2016   GLUCOSE 107 (H) 12/13/2022     The ASCVD Risk score (Arnett DK, et al., 2019) failed to calculate for the following reasons:   Cannot find a previous HDL lab   Cannot find a previous total cholesterol lab   Current Outpatient Medications on File Prior to Visit  Medication Sig Dispense Refill   valsartan (DIOVAN) 40 MG tablet Take 1 tablet (40 mg total) by mouth daily. 30 tablet 3   methocarbamol (ROBAXIN) 500 MG tablet Take 1 tablet (500 mg total) by mouth every 8 (eight) hours as needed for muscle spasms. (Patient not taking: Reported on 12/13/2022) 20 tablet 0   naproxen (NAPROSYN) 500 MG tablet Take 1 tablet (500 mg total) by mouth 2 (two) times daily with a meal. 20  tablet 2   No current facility-administered medications on file prior to visit.   No past medical history on file. No past surgical history on file.  Family History  Problem Relation Age of Onset   Lung cancer Mother    COPD Father    Social History   Socioeconomic History   Marital status: Married    Spouse name: Not on file   Number of children: Not on file   Years of education: Not on file   Highest education level: Not on file  Occupational History   Not on file  Tobacco Use   Smoking status: Former    Packs/day: 0.00    Types: Cigarettes   Smokeless tobacco: Not on file  Substance and Sexual Activity   Alcohol use: Yes    Comment: daily use.  Last drank 6 pack beer and 2-3 shots of liqour   Drug use: Not on file   Sexual activity: Not on file  Other Topics Concern   Not on file  Social History Narrative   Not on file   Social Determinants of Health   Financial Resource Strain: Not on file  Food Insecurity: Not on file  Transportation Needs: Not on file  Physical Activity: Not on file  Stress: Not on file  Social Connections: Not on file    Review of Systems  See pertinent positives and negatives per HPI.  Objective:  BP (!) 142/101   Pulse 88  Temp (!) 97.4 F (36.3 C)   Ht 6\' 1"  (1.854 m)   Wt 186 lb (84.4 kg)   SpO2 99%   BMI 24.54 kg/m       12/28/2022    3:45 PM 12/13/2022    4:23 PM 12/13/2022    3:40 PM  BP/Weight  Systolic BP  035 009  Diastolic BP  381 829  Wt. (Lbs) 186  183  BMI 24.54 kg/m2  24.82 kg/m2    Physical Exam Vitals reviewed.  Constitutional:      Appearance: He is ill-appearing (facial grimacing, ambulating with cane).  Cardiovascular:     Rate and Rhythm: Normal rate and regular rhythm.     Pulses: Normal pulses.     Heart sounds: Normal heart sounds.  Pulmonary:     Effort: Pulmonary effort is normal.     Breath sounds: Normal breath sounds.  Skin:    General: Skin is warm.     Capillary Refill: Capillary  refill takes less than 2 seconds.  Neurological:     Mental Status: He is alert and oriented to person, place, and time.  Psychiatric:        Behavior: Behavior normal.         Lab Results  Component Value Date   WBC 17.3 (H) 12/13/2022   HGB 17.3 12/13/2022   HCT 51.9 (H) 12/13/2022   PLT 364 12/13/2022   GLUCOSE 107 (H) 12/13/2022   ALT 22 12/13/2022   AST 13 12/13/2022   NA 136 12/13/2022   K 4.1 12/13/2022   CL 99 12/13/2022   CREATININE 0.87 12/13/2022   BUN 10 12/13/2022   CO2 21 12/13/2022   TSH 1.310 12/13/2022   INR 0.92 04/02/2016      Assessment & Plan:   1. Essential hypertension-not at goal - valsartan (DIOVAN) 80 MG tablet; Take 1 tablet (80 mg total) by mouth daily.  Dispense: 90 tablet; Refill: 3 - CBC with Differential/Platelet - Comprehensive metabolic panel  2. Spinal stenosis at L4-L5 level - CBC with Differential/Platelet - Comprehensive metabolic panel  3. Lumbar nerve root impingement - CBC with Differential/Platelet - Comprehensive metabolic panel    Increase Valsartan to 80 mg daily Eat a low salt (DASH) diet Monitor BP, keep log Notify office immediately of any adverse side effects We will call you with lab results Return in 4 weeks for follow-up of hypertension, bring BP log   Follow-up: 4-weeks  An After Visit Summary was printed and given to the patient.  I, Rip Harbour, NP, have reviewed all documentation for this visit. The documentation on 12/28/22 for the exam, diagnosis, procedures, and orders are all accurate and complete.   Rip Harbour, NP [place] [phone]

## 2022-12-28 NOTE — Patient Instructions (Addendum)
Increase Valsartan to 80 mg daily Eat a low salt (DASH) diet Monitor BP, keep log Notify office immediately of any adverse side effects We will call you with lab results Return in 4 weeks for follow-up of hypertension, bring BP log   Managing Your Hypertension Hypertension, also called high blood pressure, is when the force of the blood pressing against the walls of the arteries is too strong. Arteries are blood vessels that carry blood from your heart throughout your body. Hypertension forces the heart to work harder to pump blood and may cause the arteries to become narrow or stiff. Understanding blood pressure readings A blood pressure reading includes a higher number over a lower number: The first, or top, number is called the systolic pressure. It is a measure of the pressure in your arteries as your heart beats. The second, or bottom number, is called the diastolic pressure. It is a measure of the pressure in your arteries as the heart relaxes. For most people, a normal blood pressure is below 120/80. Your personal target blood pressure may vary depending on your medical conditions, your age, and other factors. Blood pressure is classified into four stages. Based on your blood pressure reading, your health care provider may use the following stages to determine what type of treatment you need, if any. Systolic pressure and diastolic pressure are measured in a unit called millimeters of mercury (mmHg). Normal Systolic pressure: below 824. Diastolic pressure: below 80. Elevated Systolic pressure: 235-361. Diastolic pressure: below 80. Hypertension stage 1 Systolic pressure: 443-154. Diastolic pressure: 00-86. Hypertension stage 2 Systolic pressure: 761 or above. Diastolic pressure: 90 or above. How can this condition affect me? Managing your hypertension is very important. Over time, hypertension can damage the arteries and decrease blood flow to parts of the body, including the  brain, heart, and kidneys. Having untreated or uncontrolled hypertension can lead to: A heart attack. A stroke. A weakened blood vessel (aneurysm). Heart failure. Kidney damage. Eye damage. Memory and concentration problems. Vascular dementia. What actions can I take to manage this condition? Hypertension can be managed by making lifestyle changes and possibly by taking medicines. Your health care provider will help you make a plan to bring your blood pressure within a normal range. You may be referred for counseling on a healthy diet and physical activity. Nutrition  Eat a diet that is high in fiber and potassium, and low in salt (sodium), added sugar, and fat. An example eating plan is called the DASH diet. DASH stands for Dietary Approaches to Stop Hypertension. To eat this way: Eat plenty of fresh fruits and vegetables. Try to fill one-half of your plate at each meal with fruits and vegetables. Eat whole grains, such as whole-wheat pasta, brown rice, or whole-grain bread. Fill about one-fourth of your plate with whole grains. Eat low-fat dairy products. Avoid fatty cuts of meat, processed or cured meats, and poultry with skin. Fill about one-fourth of your plate with lean proteins such as fish, chicken without skin, beans, eggs, and tofu. Avoid pre-made and processed foods. These tend to be higher in sodium, added sugar, and fat. Reduce your daily sodium intake. Many people with hypertension should eat less than 1,500 mg of sodium a day. Lifestyle  Work with your health care provider to maintain a healthy body weight or to lose weight. Ask what an ideal weight is for you. Get at least 30 minutes of exercise that causes your heart to beat faster (aerobic exercise) most days of the  week. Activities may include walking, swimming, or biking. Include exercise to strengthen your muscles (resistance exercise), such as weight lifting, as part of your weekly exercise routine. Try to do these types  of exercises for 30 minutes at least 3 days a week. Do not use any products that contain nicotine or tobacco. These products include cigarettes, chewing tobacco, and vaping devices, such as e-cigarettes. If you need help quitting, ask your health care provider. Control any long-term (chronic) conditions you have, such as high cholesterol or diabetes. Identify your sources of stress and find ways to manage stress. This may include meditation, deep breathing, or making time for fun activities. Alcohol use Do not drink alcohol if: Your health care provider tells you not to drink. You are pregnant, may be pregnant, or are planning to become pregnant. If you drink alcohol: Limit how much you have to: 0-1 drink a day for women. 0-2 drinks a day for men. Know how much alcohol is in your drink. In the [place] one drink equals one 12 oz bottle of beer (355 mL), one 5 oz glass of wine (148 mL), or one 1 oz glass of hard liquor (44 mL). Medicines Your health care provider may prescribe medicine if lifestyle changes are not enough to get your blood pressure under control and if: Your systolic blood pressure is 130 or higher. Your diastolic blood pressure is 80 or higher. Take medicines only as told by your health care provider. Follow the directions carefully. Blood pressure medicines must be taken as told by your health care provider. The medicine does not work as well when you skip doses. Skipping doses also puts you at risk for problems. Monitoring Before you monitor your blood pressure: Do not smoke, drink caffeinated beverages, or exercise within 30 minutes before taking a measurement. Use the bathroom and empty your bladder (urinate). Sit quietly for at least 5 minutes before taking measurements. Monitor your blood pressure at home as told by your health care provider. To do this: Sit with your back straight and supported. Place your feet flat on the floor. Do not cross your legs. Support your arm  on a flat surface, such as a table. Make sure your upper arm is at heart level. Each time you measure, take two or three readings one minute apart and record the results. You may also need to have your blood pressure checked regularly by your health care provider. General information Talk with your health care provider about your diet, exercise habits, and other lifestyle factors that may be contributing to hypertension. Review all the medicines you take with your health care provider because there may be side effects or interactions. Keep all follow-up visits. Your health care provider can help you create and adjust your plan for managing your high blood pressure. Where to find more information National Heart, Lung, and Blood Institute: PopSteam.is American Heart Association: www.heart.org Contact a health care provider if: You think you are having a reaction to medicines you have taken. You have repeated (recurrent) headaches. You feel dizzy. You have swelling in your ankles. You have trouble with your vision. Get help right away if: You develop a severe headache or confusion. You have unusual weakness or numbness, or you feel faint. You have severe pain in your chest or abdomen. You vomit repeatedly. You have trouble breathing. These symptoms may be an emergency. Get help right away. Call 911. Do not wait to see if the symptoms will go away. Do not drive yourself to  the hospital. Summary Hypertension is when the force of blood pumping through your arteries is too strong. If this condition is not controlled, it may put you at risk for serious complications. Your personal target blood pressure may vary depending on your medical conditions, your age, and other factors. For most people, a normal blood pressure is less than 120/80. Hypertension is managed by lifestyle changes, medicines, or both. Lifestyle changes to help manage hypertension include losing weight, eating a healthy,  low-sodium diet, exercising more, stopping smoking, and limiting alcohol. This information is not intended to replace advice given to you by your health care provider. Make sure you discuss any questions you have with your health care provider. Document Revised: 08/11/2021 Document Reviewed: 08/11/2021 Elsevier Patient Education  2023 [employer] DASH Eating Plan DASH stands for Dietary Approaches to Stop Hypertension. The DASH eating plan is a healthy eating plan that has been shown to: Reduce high blood pressure (hypertension). Reduce your risk for type 2 diabetes, heart disease, and stroke. Help with weight loss. What are tips for following this plan? Reading food labels Check food labels for the amount of salt (sodium) per serving. Choose foods with less than 5 percent of the Daily Value of sodium. Generally, foods with less than 300 milligrams (mg) of sodium per serving fit into this eating plan. To find whole grains, look for the word "whole" as the first word in the ingredient list. Shopping Buy products labeled as "low-sodium" or "no salt added." Buy fresh foods. Avoid canned foods and pre-made or frozen meals. Cooking Avoid adding salt when cooking. Use salt-free seasonings or herbs instead of table salt or sea salt. Check with your health care provider or pharmacist before using salt substitutes. Do not fry foods. Cook foods using healthy methods such as baking, boiling, grilling, roasting, and broiling instead. Cook with heart-healthy oils, such as olive, canola, avocado, soybean, or sunflower oil. Meal planning  Eat a balanced diet that includes: 4 or more servings of fruits and 4 or more servings of vegetables each day. Try to fill one-half of your plate with fruits and vegetables. 6-8 servings of whole grains each day. Less than 6 oz (170 g) of lean meat, poultry, or fish each day. A 3-oz (85-g) serving of meat is about the same size as a deck of cards. One egg equals 1 oz  (28 g). 2-3 servings of low-fat dairy each day. One serving is 1 cup (237 mL). 1 serving of nuts, seeds, or beans 5 times each week. 2-3 servings of heart-healthy fats. Healthy fats called omega-3 fatty acids are found in foods such as walnuts, flaxseeds, fortified milks, and eggs. These fats are also found in cold-water fish, such as sardines, salmon, and mackerel. Limit how much you eat of: Canned or prepackaged foods. Food that is high in trans fat, such as some fried foods. Food that is high in saturated fat, such as fatty meat. Desserts and other sweets, sugary drinks, and other foods with added sugar. Full-fat dairy products. Do not salt foods before eating. Do not eat more than 4 egg yolks a week. Try to eat at least 2 vegetarian meals a week. Eat more home-cooked food and less restaurant, buffet, and fast food. Lifestyle When eating at a restaurant, ask that your food be prepared with less salt or no salt, if possible. If you drink alcohol: Limit how much you use to: 0-1 drink a day for women who are not pregnant. 0-2 drinks a day for  men. Be aware of how much alcohol is in your drink. In the [place] one drink equals one 12 oz bottle of beer (355 mL), one 5 oz glass of wine (148 mL), or one 1 oz glass of hard liquor (44 mL). General information Avoid eating more than 2,300 mg of salt a day. If you have hypertension, you may need to reduce your sodium intake to 1,500 mg a day. Work with your health care provider to maintain a healthy body weight or to lose weight. Ask what an ideal weight is for you. Get at least 30 minutes of exercise that causes your heart to beat faster (aerobic exercise) most days of the week. Activities may include walking, swimming, or biking. Work with your health care provider or dietitian to adjust your eating plan to your individual calorie needs. What foods should I eat? Fruits All fresh, dried, or frozen fruit. Canned fruit in natural juice (without  added sugar). Vegetables Fresh or frozen vegetables (raw, steamed, roasted, or grilled). Low-sodium or reduced-sodium tomato and vegetable juice. Low-sodium or reduced-sodium tomato sauce and tomato paste. Low-sodium or reduced-sodium canned vegetables. Grains Whole-grain or whole-wheat bread. Whole-grain or whole-wheat pasta. Brown rice. [place]. Bulgur. Whole-grain and low-sodium cereals. Pita bread. Low-fat, low-sodium crackers. Whole-wheat flour tortillas. Meats and other proteins Skinless chicken or [place]. Ground chicken or [place]. Pork with fat trimmed off. Fish and seafood. Egg whites. Dried beans, peas, or lentils. Unsalted nuts, nut butters, and seeds. Unsalted canned beans. Lean cuts of beef with fat trimmed off. Low-sodium, lean precooked or cured meat, such as sausages or meat loaves. Dairy Low-fat (1%) or fat-free (skim) milk. Reduced-fat, low-fat, or fat-free cheeses. Nonfat, low-sodium ricotta or cottage cheese. Low-fat or nonfat yogurt. Low-fat, low-sodium cheese. Fats and oils Soft margarine without trans fats. Vegetable oil. Reduced-fat, low-fat, or light mayonnaise and salad dressings (reduced-sodium). Canola, safflower, olive, avocado, soybean, and sunflower oils. Avocado. Seasonings and condiments Herbs. Spices. Seasoning mixes without salt. Other foods Unsalted popcorn and pretzels. Fat-free sweets. The items listed above may not be a complete list of foods and beverages you can eat. Contact a dietitian for more information. What foods should I avoid? Fruits Canned fruit in a light or heavy syrup. Fried fruit. Fruit in cream or butter sauce. Vegetables Creamed or fried vegetables. Vegetables in a cheese sauce. Regular canned vegetables (not low-sodium or reduced-sodium). Regular canned tomato sauce and paste (not low-sodium or reduced-sodium). Regular tomato and vegetable juice (not low-sodium or reduced-sodium). Angie Fava. Olives. Grains Baked goods made with fat,  such as croissants, muffins, or some breads. Dry pasta or rice meal packs. Meats and other proteins Fatty cuts of meat. Ribs. Fried meat. Berniece Salines. Bologna, salami, and other precooked or cured meats, such as sausages or meat loaves. Fat from the back of a pig (fatback). Bratwurst. Salted nuts and seeds. Canned beans with added salt. Canned or smoked fish. Whole eggs or egg yolks. Chicken or [place] with skin. Dairy Whole or 2% milk, cream, and half-and-half. Whole or full-fat cream cheese. Whole-fat or sweetened yogurt. Full-fat cheese. Nondairy creamers. Whipped toppings. Processed cheese and cheese spreads. Fats and oils Butter. Stick margarine. Lard. Shortening. Ghee. Bacon fat. Tropical oils, such as coconut, palm kernel, or palm oil. Seasonings and condiments Onion salt, garlic salt, seasoned salt, table salt, and sea salt. Worcestershire sauce. Tartar sauce. Barbecue sauce. Teriyaki sauce. Soy sauce, including reduced-sodium. Steak sauce. Canned and packaged gravies. Fish sauce. Oyster sauce. Cocktail sauce. Store-bought horseradish. Ketchup. Mustard. Meat flavorings and  tenderizers. Bouillon cubes. Hot sauces. Pre-made or packaged marinades. Pre-made or packaged taco seasonings. Relishes. Regular salad dressings. Other foods Salted popcorn and pretzels. The items listed above may not be a complete list of foods and beverages you should avoid. Contact a dietitian for more information. Where to find more information National Heart, Lung, and Blood Institute: PopSteam.is American Heart Association: www.heart.org Academy of Nutrition and Dietetics: www.eatright.org National Kidney Foundation: www.kidney.org Summary The DASH eating plan is a healthy eating plan that has been shown to reduce high blood pressure (hypertension). It may also reduce your risk for type 2 diabetes, heart disease, and stroke. When on the DASH eating plan, aim to eat more fresh fruits and vegetables, whole grains,  lean proteins, low-fat dairy, and heart-healthy fats. With the DASH eating plan, you should limit salt (sodium) intake to 2,300 mg a day. If you have hypertension, you may need to reduce your sodium intake to 1,500 mg a day. Work with your health care provider or dietitian to adjust your eating plan to your individual calorie needs. This information is not intended to replace advice given to you by your health care provider. Make sure you discuss any questions you have with your health care provider. Document Revised: 10/31/2019 Document Reviewed: 10/31/2019 Elsevier Patient Education  2023 [employer].

## 2022-12-29 LAB — COMPREHENSIVE METABOLIC PANEL
ALT: 18 IU/L (ref 0–44)
AST: 12 IU/L (ref 0–40)
Albumin/Globulin Ratio: 2 (ref 1.2–2.2)
Albumin: 4.3 g/dL (ref 3.8–4.9)
Alkaline Phosphatase: 77 IU/L (ref 44–121)
BUN/Creatinine Ratio: 14 (ref 9–20)
BUN: 13 mg/dL (ref 6–24)
Bilirubin Total: 0.2 mg/dL (ref 0.0–1.2)
CO2: 21 mmol/L (ref 20–29)
Calcium: 9.8 mg/dL (ref 8.7–10.2)
Chloride: 100 mmol/L (ref 96–106)
Creatinine, Ser: 0.94 mg/dL (ref 0.76–1.27)
Globulin, Total: 2.1 g/dL (ref 1.5–4.5)
Glucose: 114 mg/dL — ABNORMAL HIGH (ref 70–99)
Potassium: 4.2 mmol/L (ref 3.5–5.2)
Sodium: 137 mmol/L (ref 134–144)
Total Protein: 6.4 g/dL (ref 6.0–8.5)
eGFR: 98 mL/min/{1.73_m2} (ref >59)

## 2022-12-29 LAB — CBC WITH DIFFERENTIAL/PLATELET
Basophils Absolute: 0.1 10*3/uL (ref 0.0–0.2)
Basos: 1 %
EOS (ABSOLUTE): 0.3 10*3/uL (ref 0.0–0.4)
Eos: 2 %
Hematocrit: 50.7 % (ref 37.5–51.0)
Hemoglobin: 17.2 g/dL (ref 13.0–17.7)
Immature Grans (Abs): 0.2 10*3/uL — ABNORMAL HIGH (ref 0.0–0.1)
Immature Granulocytes: 1 %
Lymphocytes Absolute: 2.8 10*3/uL (ref 0.7–3.1)
Lymphs: 21 %
MCH: 28.6 pg (ref 26.6–33.0)
MCHC: 33.9 g/dL (ref 31.5–35.7)
MCV: 84 fL (ref 79–97)
Monocytes Absolute: 0.7 10*3/uL (ref 0.1–0.9)
Monocytes: 5 %
Neutrophils Absolute: 8.9 10*3/uL — ABNORMAL HIGH (ref 1.4–7.0)
Neutrophils: 70 %
Platelets: 342 10*3/uL (ref 150–450)
RBC: 6.01 x10E6/uL — ABNORMAL HIGH (ref 4.14–5.80)
RDW: 13.3 % (ref 11.6–15.4)
WBC: 12.9 10*3/uL — ABNORMAL HIGH (ref 3.4–10.8)

## 2023-01-01 DIAGNOSIS — M48062 Spinal stenosis, lumbar region with neurogenic claudication: Secondary | ICD-10-CM | POA: Diagnosis not present

## 2023-01-01 DIAGNOSIS — M48061 Spinal stenosis, lumbar region without neurogenic claudication: Secondary | ICD-10-CM | POA: Diagnosis not present

## 2023-01-01 DIAGNOSIS — M21372 Foot drop, left foot: Secondary | ICD-10-CM | POA: Diagnosis not present

## 2023-01-01 DIAGNOSIS — M5116 Intervertebral disc disorders with radiculopathy, lumbar region: Secondary | ICD-10-CM | POA: Diagnosis not present

## 2023-02-05 ENCOUNTER — Other Ambulatory Visit: Payer: Self-pay | Admitting: Physician Assistant

## 2023-02-05 DIAGNOSIS — I10 Essential (primary) hypertension: Secondary | ICD-10-CM

## 2023-02-05 MED ORDER — VALSARTAN 320 MG PO TABS: 320.0000 mg | ORAL_TABLET | Freq: Every day | ORAL | 0 refills | Status: DC

## 2023-03-18 NOTE — Telephone Encounter (Signed)
error 

## 2023-03-27 ENCOUNTER — Encounter: Payer: Self-pay | Admitting: Physician Assistant

## 2023-03-27 ENCOUNTER — Ambulatory Visit: Payer: BC Managed Care – PPO | Admitting: Physician Assistant

## 2023-03-27 VITALS — BP 128/74 | HR 78 | Temp 97.7°F | Ht 73.0 in | Wt 197.0 lb

## 2023-03-27 DIAGNOSIS — M48061 Spinal stenosis, lumbar region without neurogenic claudication: Secondary | ICD-10-CM | POA: Diagnosis not present

## 2023-03-27 DIAGNOSIS — M5416 Radiculopathy, lumbar region: Secondary | ICD-10-CM | POA: Diagnosis not present

## 2023-03-27 DIAGNOSIS — Z125 Encounter for screening for malignant neoplasm of prostate: Secondary | ICD-10-CM

## 2023-03-27 DIAGNOSIS — J3089 Other allergic rhinitis: Secondary | ICD-10-CM

## 2023-03-27 DIAGNOSIS — I10 Essential (primary) hypertension: Secondary | ICD-10-CM | POA: Diagnosis not present

## 2023-03-27 MED ORDER — MONTELUKAST SODIUM 10 MG PO TABS
10.0000 mg | ORAL_TABLET | Freq: Every day | ORAL | 3 refills | Status: DC
Start: 2023-03-27 — End: 2024-04-14

## 2023-03-27 MED ORDER — LORATADINE 10 MG PO TABS
10.0000 mg | ORAL_TABLET | Freq: Every day | ORAL | 3 refills | Status: DC
Start: 2023-03-27 — End: 2024-06-12

## 2023-03-27 NOTE — Progress Notes (Signed)
Subjective:  Patient ID: Walter Pratt, male    DOB: 1971/04/02  Age: 52 y.o. MRN: [id number]  Chief Complaint  Patient presents with   Hypertension    HPI  Pt presents for follow up of hypertension. The patient is tolerating the medication well without side effects. Compliance with treatment has been good; including taking medication as directed , maintains a healthy diet and regular exercise regimen , and following up as directed. He is currently taking diovan  qd.  States bp at home ranging 120s/70s-80s Denies chest pain/dyspnea/edema  Pt does have trouble with allergies and chronic cough - he takes otc allergy med.  Pt is smoker of 1-2ppd cigarettes.  Not interested in tobacco cessation.  Defers lung cancer screening  Pt declines colonoscopy and cologuard  Pt had lumbar surgery for spinal stenosis - is healing well and currently in physical therapy - will have follow up with neurosurgeon in 2 weeks with plans to return to work soon       No data to display               11/14/2022    7:30 AM  Fall Risk  (RETIRED) Patient Fall Risk Level Low fall risk      Review of Systems CONSTITUTIONAL: Negative for chills, fatigue, fever, unintentional weight gain and unintentional weight loss.  E/N/T: see HPI CARDIOVASCULAR: Negative for chest pain, dizziness, palpitations and pedal edema.  RESPIRATORY: see HPI GASTROINTESTINAL: Negative for abdominal pain, acid reflux symptoms, constipation, diarrhea, nausea and vomiting.       Current Outpatient Medications on File Prior to Visit  Medication Sig Dispense Refill   valsartan (DIOVAN) 320 MG tablet Take 1 tablet (320 mg total) by mouth daily. 90 tablet 0   No current facility-administered medications on file prior to visit.   History reviewed. No pertinent past medical history. History reviewed. No pertinent surgical history.  Family History  Problem Relation Age of Onset   Lung cancer Mother    COPD Father     Social History   Socioeconomic History   Marital status: Married    Spouse name: Not on file   Number of children: Not on file   Years of education: Not on file   Highest education level: Not on file  Occupational History   Not on file  Tobacco Use   Smoking status: Former    Packs/day: 0    Types: Cigarettes   Smokeless tobacco: Not on file  Substance and Sexual Activity   Alcohol use: Yes    Comment: daily use.  Last drank 6 pack beer and 2-3 shots of liqour   Drug use: Not on file   Sexual activity: Not on file  Other Topics Concern   Not on file  Social History Narrative   Not on file   Social Determinants of Health   Financial Resource Strain: Not on file  Food Insecurity: Not on file  Transportation Needs: Not on file  Physical Activity: Not on file  Stress: Not on file  Social Connections: Not on file    Objective:  PHYSICAL EXAM:   VS: BP 128/74   Pulse 78   Temp 97.7 F (36.5 C) (Temporal)   Ht  (1.854 m)   Wt 197 lb (89.4 kg)   SpO2 98%   BMI 25.99 kg/m   GEN: Well nourished, well developed, in no acute distress  Cardiac: RRR; no murmurs, rubs, or gallops,no edema -  Respiratory:  normal respiratory  rate and pattern with no distress - normal breath sounds with no rales, rhonchi, wheezes or rubs GI: normal bowel sounds, no masses or tenderness Skin: warm and dry, no rash  Psych: euthymic mood, appropriate affect and demeanor  Lab Results  Component Value Date   WBC 12.9 (H) 12/28/2022   HGB 17.2 12/28/2022   HCT 50.7 12/28/2022   PLT 342 12/28/2022   GLUCOSE 114 (H) 12/28/2022   ALT 18 12/28/2022   AST 12 12/28/2022   NA 137 12/28/2022   K 4.2 12/28/2022   CL 100 12/28/2022   CREATININE 0.94 12/28/2022   BUN 13 12/28/2022   CO2 21 12/28/2022   TSH 1.310 12/13/2022   INR 0.92 04/02/2016      Assessment & Plan:    Essential hypertension -     CBC with Differential/Platelet -     Comprehensive metabolic panel -     Lipid  panel -     TSH Continue current med Spinal stenosis at L4-L5 level Continue follow up with neurosurgeon and physical therapy Seasonal allergic rhinitis due to other allergic trigger -     Loratadine; Take 1 tablet (10 mg total) by mouth daily.  Dispense: 90 tablet; Refill: 3 -     Montelukast Sodium; Take 1 tablet (10 mg total) by mouth at bedtime.  Dispense: 90 tablet; Refill: 3  Prostate cancer screening -     PSA     Meds ordered this encounter  Medications   loratadine (CLARITIN) 10 MG tablet    Sig: Take 1 tablet (10 mg total) by mouth daily.    Dispense:  90 tablet    Refill:  3    Order Specific Question:   Supervising Provider    Answer:   COX, KIRSTEN [[id number]]   montelukast (SINGULAIR) 10 MG tablet    Sig: Take 1 tablet (10 mg total) by mouth at bedtime.    Dispense:  90 tablet    Refill:  3    Order Specific Question:   Supervising Provider    AnswerBlane Ohara [phone]    Orders Placed This Encounter  Procedures   CBC with Differential/Platelet   Comprehensive metabolic panel   Lipid panel   TSH   PSA     Follow-up: Return in about 6 months (around 09/26/2023) for chronic follow up.  Pt will have labwork done at outside facility

## 2023-04-02 DIAGNOSIS — M5416 Radiculopathy, lumbar region: Secondary | ICD-10-CM | POA: Diagnosis not present

## 2023-04-06 DIAGNOSIS — M5416 Radiculopathy, lumbar region: Secondary | ICD-10-CM | POA: Diagnosis not present

## 2023-04-09 DIAGNOSIS — M5416 Radiculopathy, lumbar region: Secondary | ICD-10-CM | POA: Diagnosis not present

## 2023-04-10 DIAGNOSIS — M5416 Radiculopathy, lumbar region: Secondary | ICD-10-CM | POA: Diagnosis not present

## 2023-04-12 DIAGNOSIS — M5416 Radiculopathy, lumbar region: Secondary | ICD-10-CM | POA: Diagnosis not present

## 2023-05-14 ENCOUNTER — Other Ambulatory Visit: Payer: Self-pay | Admitting: Physician Assistant

## 2023-05-14 DIAGNOSIS — I10 Essential (primary) hypertension: Secondary | ICD-10-CM

## 2023-09-26 ENCOUNTER — Ambulatory Visit: Payer: BC Managed Care – PPO | Admitting: Physician Assistant

## 2023-11-09 ENCOUNTER — Other Ambulatory Visit: Payer: Self-pay | Admitting: Physician Assistant

## 2023-11-09 DIAGNOSIS — I10 Essential (primary) hypertension: Secondary | ICD-10-CM

## 2023-11-26 ENCOUNTER — Ambulatory Visit: Payer: BC Managed Care – PPO | Admitting: Physician Assistant

## 2024-04-13 ENCOUNTER — Other Ambulatory Visit: Payer: Self-pay | Admitting: Physician Assistant

## 2024-04-13 DIAGNOSIS — J3089 Other allergic rhinitis: Secondary | ICD-10-CM

## 2024-05-12 ENCOUNTER — Ambulatory Visit: Admitting: Physician Assistant

## 2024-05-15 ENCOUNTER — Other Ambulatory Visit: Payer: Self-pay | Admitting: Physician Assistant

## 2024-05-15 DIAGNOSIS — I10 Essential (primary) hypertension: Secondary | ICD-10-CM

## 2024-06-12 ENCOUNTER — Encounter: Payer: Self-pay | Admitting: Physician Assistant

## 2024-06-12 ENCOUNTER — Ambulatory Visit: Admitting: Physician Assistant

## 2024-06-12 ENCOUNTER — Other Ambulatory Visit: Payer: Self-pay

## 2024-06-12 VITALS — BP 120/68 | HR 63 | Temp 97.5°F | Ht 73.0 in | Wt 182.0 lb

## 2024-06-12 DIAGNOSIS — J3089 Other allergic rhinitis: Secondary | ICD-10-CM | POA: Diagnosis not present

## 2024-06-12 DIAGNOSIS — I10 Essential (primary) hypertension: Secondary | ICD-10-CM | POA: Diagnosis not present

## 2024-06-12 DIAGNOSIS — Z125 Encounter for screening for malignant neoplasm of prostate: Secondary | ICD-10-CM

## 2024-06-12 MED ORDER — MONTELUKAST SODIUM 10 MG PO TABS
10.0000 mg | ORAL_TABLET | Freq: Every day | ORAL | 3 refills | Status: DC
  Filled 2024-06-12: qty 90, 90d supply, fill #0

## 2024-06-12 MED ORDER — LORATADINE 10 MG PO TABS
10.0000 mg | ORAL_TABLET | Freq: Every day | ORAL | 3 refills | Status: DC
  Filled 2024-06-12: qty 90, 90d supply, fill #0

## 2024-06-12 MED ORDER — VALSARTAN 320 MG PO TABS
320.0000 mg | ORAL_TABLET | Freq: Every day | ORAL | 3 refills | Status: AC
  Filled 2024-06-12 – 2024-09-11 (×2): qty 90, 90d supply, fill #0
  Filled 2024-12-16: qty 90, 90d supply, fill #1

## 2024-06-12 NOTE — Progress Notes (Signed)
   Subjective:  Patient ID: Walter Pratt, male    DOB: [birth year]  Age: 53 y.o. MRN: [id number]  Chief Complaint  Patient presents with   Medical Management of Chronic Issues    HPI Pt presents for follow up of hypertension. The patient is tolerating the medication well without side effects. Compliance with treatment has been good; including taking medication as directed , maintains a healthy diet and regular exercise regimen , and following up as directed. Taking diovan  320mg  qd  Pt with allergies - stable on claritin  and singulair      06/12/2024   10:39 AM  Depression screen PHQ 2/9  Decreased Interest 0  Down, Depressed, Hopeless 0  PHQ - 2 Score 0        11/14/2022    7:30 AM 06/12/2024   10:39 AM  Fall Risk  Falls in the past year?  0  Was there an injury with Fall?  0  Fall Risk Category Calculator  0  (RETIRED) Patient Fall Risk Level Low fall risk    Patient at Risk for Falls Due to  No Fall Risks     Data saved with a previous flowsheet row definition     ROS CONSTITUTIONAL: Negative for chills, fatigue, fever, unintentional weight gain and unintentional weight loss.  E/N/T: Negative for ear pain, nasal congestion and sore throat.  CARDIOVASCULAR: Negative for chest pain, dizziness, palpitations and pedal edema.  RESPIRATORY: Negative for recent cough and dyspnea.  GASTROINTESTINAL: Negative for abdominal pain, acid reflux symptoms, constipation, diarrhea, nausea and vomiting.  MSK: Negative for arthralgias and myalgias.  INTEGUMENTARY: Negative for rash.  NEUROLOGICAL: Negative for dizziness and headaches.  PSYCHIATRIC: Negative for sleep disturbance and to question depression screen.  Negative for depression, negative for anhedonia.    Current Outpatient Medications:    loratadine  (CLARITIN ) 10 MG tablet, Take 1 tablet (10 mg total) by mouth daily., Disp: 90 tablet, Rfl: 3   montelukast  (SINGULAIR ) 10 MG tablet, TAKE 1 TABLET BY MOUTH EVERYDAY AT BEDTIME, Disp:  90 tablet, Rfl: 3   valsartan  (DIOVAN ) 320 MG tablet, TAKE 1 TABLET BY MOUTH EVERY DAY, Disp: 90 tablet, Rfl: 0  History reviewed. No pertinent past medical history. Objective:  PHYSICAL EXAM:   BP 120/68   Pulse 63   Temp (!) 97.5 F (36.4 C)   Ht 6' 1 (1.854 m)   Wt 182 lb (82.6 kg)   SpO2 98%   BMI 24.01 kg/m    GEN: Well nourished, well developed, in no acute distress   Cardiac: RRR; no murmurs, rubs, or gallops,no edema - Respiratory:  normal respiratory rate and pattern with no distress - normal breath sounds with no rales, rhonchi, wheezes or rubs  Skin: warm and dry, no rash  Neuro:  Alert and Oriented x 3,  - CN II-Xii grossly intact Psych: euthymic mood, appropriate affect and demeanor  Assessment & Plan:    Essential hypertension -     CBC with Differential/Platelet -     Comprehensive metabolic panel with GFR -     TSH -     Lipid panel  Seasonal allergic rhinitis due to other allergic trigger  Prostate cancer screening -     PSA     Follow-up: Return in about 6 months (around 12/13/2024) for chronic fasting follow-up.  An After Visit Summary was printed and given to the patient.  CAMIE JONELLE NICHOLAUS DEVONNA [facility] [phone]

## 2024-06-13 LAB — CBC WITH DIFFERENTIAL/PLATELET
Basophils Absolute: 0.1 x10E3/uL (ref 0.0–0.2)
Basos: 1 %
EOS (ABSOLUTE): 0.4 x10E3/uL (ref 0.0–0.4)
Eos: 4 %
Hematocrit: 48.4 % (ref 37.5–51.0)
Hemoglobin: 15.4 g/dL (ref 13.0–17.7)
Immature Grans (Abs): 0.1 x10E3/uL (ref 0.0–0.1)
Immature Granulocytes: 1 %
Lymphocytes Absolute: 2.7 x10E3/uL (ref 0.7–3.1)
Lymphs: 25 %
MCH: 28.3 pg (ref 26.6–33.0)
MCHC: 31.8 g/dL (ref 31.5–35.7)
MCV: 89 fL (ref 79–97)
Monocytes Absolute: 0.6 x10E3/uL (ref 0.1–0.9)
Monocytes: 6 %
Neutrophils Absolute: 6.8 x10E3/uL (ref 1.4–7.0)
Neutrophils: 63 %
Platelets: 329 x10E3/uL (ref 150–450)
RBC: 5.44 x10E6/uL (ref 4.14–5.80)
RDW: 13.2 % (ref 11.6–15.4)
WBC: 10.8 x10E3/uL (ref 3.4–10.8)

## 2024-06-13 LAB — COMPREHENSIVE METABOLIC PANEL WITH GFR
ALT: 9 IU/L (ref 0–44)
AST: 10 IU/L (ref 0–40)
Albumin: 4.3 g/dL (ref 3.8–4.9)
Alkaline Phosphatase: 87 IU/L (ref 44–121)
BUN/Creatinine Ratio: 14 (ref 9–20)
BUN: 13 mg/dL (ref 6–24)
Bilirubin Total: 0.2 mg/dL (ref 0.0–1.2)
CO2: 23 mmol/L (ref 20–29)
Calcium: 9.8 mg/dL (ref 8.7–10.2)
Chloride: 104 mmol/L (ref 96–106)
Creatinine, Ser: 0.96 mg/dL (ref 0.76–1.27)
Globulin, Total: 2.2 g/dL (ref 1.5–4.5)
Glucose: 89 mg/dL (ref 70–99)
Potassium: 5.1 mmol/L (ref 3.5–5.2)
Sodium: 140 mmol/L (ref 134–144)
Total Protein: 6.5 g/dL (ref 6.0–8.5)
eGFR: 95 mL/min/1.73 (ref >59)

## 2024-06-13 LAB — LIPID PANEL
Chol/HDL Ratio: 5.6 ratio — ABNORMAL HIGH (ref 0.0–5.0)
Cholesterol, Total: 190 mg/dL (ref 100–199)
HDL: 34 mg/dL — ABNORMAL LOW (ref >39)
LDL Chol Calc (NIH): 135 mg/dL — ABNORMAL HIGH (ref 0–99)
Triglycerides: 116 mg/dL (ref 0–149)
VLDL Cholesterol Cal: 21 mg/dL (ref 5–40)

## 2024-06-13 LAB — PSA: Prostate Specific Ag, Serum: 1 ng/mL (ref 0.0–4.0)

## 2024-06-13 LAB — TSH: TSH: 1.44 u[IU]/mL (ref 0.450–4.500)

## 2024-06-16 ENCOUNTER — Ambulatory Visit: Payer: Self-pay | Admitting: Physician Assistant

## 2024-06-24 ENCOUNTER — Other Ambulatory Visit: Payer: Self-pay

## 2024-07-21 ENCOUNTER — Other Ambulatory Visit: Payer: Self-pay | Admitting: Physician Assistant

## 2024-07-21 ENCOUNTER — Other Ambulatory Visit: Payer: Self-pay

## 2024-07-21 DIAGNOSIS — J3089 Other allergic rhinitis: Secondary | ICD-10-CM

## 2024-07-21 MED ORDER — MONTELUKAST SODIUM 10 MG PO TABS
10.0000 mg | ORAL_TABLET | Freq: Every day | ORAL | 3 refills | Status: DC
Start: 2024-07-21 — End: 2024-07-25
  Filled 2024-07-21: qty 90, 90d supply, fill #0

## 2024-07-21 MED ORDER — PREDNISONE 20 MG PO TABS
ORAL_TABLET | ORAL | 0 refills | Status: AC
  Filled 2024-07-21: qty 18, 9d supply, fill #0

## 2024-07-25 ENCOUNTER — Other Ambulatory Visit: Payer: Self-pay

## 2024-07-25 ENCOUNTER — Other Ambulatory Visit: Payer: Self-pay | Admitting: Physician Assistant

## 2024-07-25 DIAGNOSIS — J3089 Other allergic rhinitis: Secondary | ICD-10-CM

## 2024-07-25 MED ORDER — MONTELUKAST SODIUM 10 MG PO TABS
10.0000 mg | ORAL_TABLET | Freq: Every day | ORAL | 3 refills | Status: AC
  Filled 2024-07-25 – 2024-11-12 (×3): qty 90, 90d supply, fill #0

## 2024-07-29 ENCOUNTER — Other Ambulatory Visit: Payer: Self-pay

## 2024-08-04 ENCOUNTER — Other Ambulatory Visit: Payer: Self-pay

## 2024-08-04 DIAGNOSIS — J3089 Other allergic rhinitis: Secondary | ICD-10-CM

## 2024-08-04 MED ORDER — LORATADINE 10 MG PO TABS
10.0000 mg | ORAL_TABLET | Freq: Every day | ORAL | 1 refills | Status: AC
  Filled 2024-08-04: qty 90, 90d supply, fill #0

## 2024-09-11 ENCOUNTER — Other Ambulatory Visit: Payer: Self-pay

## 2024-11-12 ENCOUNTER — Other Ambulatory Visit: Payer: Self-pay

## 2024-11-13 ENCOUNTER — Other Ambulatory Visit: Payer: Self-pay

## 2024-12-15 ENCOUNTER — Ambulatory Visit: Admitting: Physician Assistant

## 2024-12-18 ENCOUNTER — Other Ambulatory Visit: Payer: Self-pay

## 2024-12-22 ENCOUNTER — Encounter: Payer: Self-pay | Admitting: Physician Assistant

## 2024-12-22 ENCOUNTER — Ambulatory Visit: Admitting: Physician Assistant

## 2024-12-22 VITALS — BP 120/80 | HR 60 | Temp 97.5°F | Resp 16 | Ht 73.0 in | Wt 181.0 lb

## 2024-12-22 DIAGNOSIS — E782 Mixed hyperlipidemia: Secondary | ICD-10-CM | POA: Diagnosis not present

## 2024-12-22 DIAGNOSIS — I10 Essential (primary) hypertension: Secondary | ICD-10-CM

## 2024-12-22 NOTE — Progress Notes (Signed)
" ° °  Subjective:  Patient ID: Walter Pratt, male    DOB: Aug 08, 1971  Age: 54 y.o. MRN: [id number]  Chief Complaint  Patient presents with   Medical Management of Chronic Issues    HPI  Pt presents for follow up of hypertension.  The patient is tolerating the medication well without side effects. Compliance with treatment has been good - does take medication as directed.  Currently on Diovan  320mg  qd Denies chest pain or dyspnea  Pt with slightly elevated lipids at last visit - watches diet - due for repeat labwork    06/12/2024   10:39 AM  Depression screen PHQ 2/9  Decreased Interest 0  Down, Depressed, Hopeless 0  PHQ - 2 Score 0        11/14/2022    7:30 AM 06/12/2024   10:39 AM  Fall Risk  Falls in the past year?  0  Was there an injury with Fall?  0   Fall Risk Category Calculator  0  (RETIRED) Patient Fall Risk Level Low fall risk    Patient at Risk for Falls Due to  No Fall Risks     Data saved with a previous flowsheet row definition     ROS CONSTITUTIONAL: Negative for chills, fatigue, fever, unintentional weight gain and unintentional weight loss.  E/N/T: Negative for ear pain, nasal congestion and sore throat.  CARDIOVASCULAR: Negative for chest pain, dizziness, palpitations and pedal edema.  RESPIRATORY: Negative for recent cough and dyspnea.  GASTROINTESTINAL: Negative for abdominal pain, acid reflux symptoms, constipation, diarrhea, nausea and vomiting.  INTEGUMENTARY: Negative for rash.  PSYCHIATRIC: Negative for sleep disturbance and to question depression screen.  Negative for depression, negative for anhedonia.   Current Medications[1]  History reviewed. No pertinent past medical history. Objective:  PHYSICAL EXAM:   BP 120/80   Pulse 60   Temp (!) 97.5 F (36.4 C)   Resp 16   Ht 6' 1 (1.854 m)   Wt 181 lb (82.1 kg)   SpO2 98%   BMI 23.88 kg/m    GEN: Well nourished, well developed, in no acute distress  Cardiac: RRR; no murmurs, rubs, or  gallops,no edema -  Respiratory:  normal respiratory rate and pattern with no distress - normal breath sounds with no rales, rhonchi, wheezes or rubs  Skin: warm and dry, no rash  Neuro:  Alert and Oriented x 3, - CN II-Xii grossly intact Psych: euthymic mood, appropriate affect and demeanor    Assessment & Plan Essential hypertension Continue current med Orders:   CBC with Differential/Platelet   Comprehensive metabolic panel with GFR   Lipid panel  Mixed hyperlipidemia Decrease fried/fatty foods in diet Orders:   Comprehensive metabolic panel with GFR   Lipid panel    Follow-up: Return in about 6 months (around 06/21/2025) for fasting physical.  An After Visit Summary was printed and given to the patient.  SARA R Doniel Maiello, PA-C [facility] [phone]    [1]  Current Outpatient Medications:    loratadine  (CLARITIN ) 10 MG tablet, Take 1 tablet (10 mg total) by mouth daily., Disp: 90 tablet, Rfl: 1   montelukast  (SINGULAIR ) 10 MG tablet, Take 1 tablet (10 mg total) by mouth daily., Disp: 90 tablet, Rfl: 3   valsartan  (DIOVAN ) 320 MG tablet, Take 1 tablet (320 mg total) by mouth daily., Disp: 90 tablet, Rfl: 3  "

## 2024-12-23 ENCOUNTER — Ambulatory Visit: Payer: Self-pay | Admitting: Physician Assistant

## 2024-12-23 LAB — CBC WITH DIFFERENTIAL/PLATELET
Basophils Absolute: 0.1 x10E3/uL (ref 0.0–0.2)
Basos: 1 %
EOS (ABSOLUTE): 0.4 x10E3/uL (ref 0.0–0.4)
Eos: 4 %
Hematocrit: 48.2 % (ref 37.5–51.0)
Hemoglobin: 15.8 g/dL (ref 13.0–17.7)
Immature Grans (Abs): 0.1 x10E3/uL (ref 0.0–0.1)
Immature Granulocytes: 1 %
Lymphocytes Absolute: 3.5 x10E3/uL — ABNORMAL HIGH (ref 0.7–3.1)
Lymphs: 34 %
MCH: 28.6 pg (ref 26.6–33.0)
MCHC: 32.8 g/dL (ref 31.5–35.7)
MCV: 87 fL (ref 79–97)
Monocytes Absolute: 0.7 x10E3/uL (ref 0.1–0.9)
Monocytes: 7 %
Neutrophils Absolute: 5.7 x10E3/uL (ref 1.4–7.0)
Neutrophils: 53 %
Platelets: 382 x10E3/uL (ref 150–450)
RBC: 5.53 x10E6/uL (ref 4.14–5.80)
RDW: 12.7 % (ref 11.6–15.4)
WBC: 10.5 x10E3/uL (ref 3.4–10.8)

## 2024-12-23 LAB — COMPREHENSIVE METABOLIC PANEL WITH GFR
ALT: 14 IU/L (ref 0–44)
AST: 12 IU/L (ref 0–40)
Albumin: 4.7 g/dL (ref 3.8–4.9)
Alkaline Phosphatase: 83 IU/L (ref 47–123)
BUN/Creatinine Ratio: 11 (ref 9–20)
BUN: 12 mg/dL (ref 6–24)
Bilirubin Total: 0.3 mg/dL (ref 0.0–1.2)
CO2: 20 mmol/L (ref 20–29)
Calcium: 9.8 mg/dL (ref 8.7–10.2)
Chloride: 105 mmol/L (ref 96–106)
Creatinine, Ser: 1.05 mg/dL (ref 0.76–1.27)
Globulin, Total: 2.2 g/dL (ref 1.5–4.5)
Glucose: 86 mg/dL (ref 70–99)
Potassium: 4.5 mmol/L (ref 3.5–5.2)
Sodium: 139 mmol/L (ref 134–144)
Total Protein: 6.9 g/dL (ref 6.0–8.5)
eGFR: 84 mL/min/1.73

## 2024-12-23 LAB — LIPID PANEL
Chol/HDL Ratio: 6.2 ratio — ABNORMAL HIGH (ref 0.0–5.0)
Cholesterol, Total: 191 mg/dL (ref 100–199)
HDL: 31 mg/dL — ABNORMAL LOW
LDL Chol Calc (NIH): 134 mg/dL — ABNORMAL HIGH (ref 0–99)
Triglycerides: 141 mg/dL (ref 0–149)
VLDL Cholesterol Cal: 26 mg/dL (ref 5–40)
